# Patient Record
Sex: Female | Born: 1995 | Race: White | Hispanic: No | Marital: Single | Smoking: Never smoker
Health system: Southern US, Community
[De-identification: ages and names within clinical notes are randomized; demographics above are authoritative.]

## PROBLEM LIST (undated history)

## (undated) DIAGNOSIS — F329 Major depressive disorder, single episode, unspecified: Secondary | ICD-10-CM

## (undated) DIAGNOSIS — J45909 Unspecified asthma, uncomplicated: Secondary | ICD-10-CM

## (undated) DIAGNOSIS — R000 Tachycardia, unspecified: Secondary | ICD-10-CM

## (undated) DIAGNOSIS — F419 Anxiety disorder, unspecified: Secondary | ICD-10-CM

## (undated) DIAGNOSIS — F909 Attention-deficit hyperactivity disorder, unspecified type: Secondary | ICD-10-CM

## (undated) DIAGNOSIS — I951 Orthostatic hypotension: Secondary | ICD-10-CM

## (undated) DIAGNOSIS — I498 Other specified cardiac arrhythmias: Secondary | ICD-10-CM

## (undated) DIAGNOSIS — F32A Depression, unspecified: Secondary | ICD-10-CM

## (undated) DIAGNOSIS — G90A Postural orthostatic tachycardia syndrome (POTS): Secondary | ICD-10-CM

## (undated) HISTORY — PX: WISDOM TOOTH EXTRACTION: SHX21

---

## 2015-10-18 ENCOUNTER — Emergency Department (HOSPITAL_COMMUNITY): Payer: BLUE CROSS/BLUE SHIELD

## 2015-10-18 ENCOUNTER — Emergency Department (HOSPITAL_COMMUNITY)
Admission: EM | Admit: 2015-10-18 | Discharge: 2015-10-19 | Disposition: A | Discharge status: Home / Self Care | Payer: BLUE CROSS/BLUE SHIELD | Attending: Emergency Medicine | Admitting: Emergency Medicine

## 2015-10-18 ENCOUNTER — Encounter (HOSPITAL_COMMUNITY): Payer: Self-pay | Admitting: Oncology

## 2015-10-18 DIAGNOSIS — Y9301 Activity, walking, marching and hiking: Secondary | ICD-10-CM | POA: Diagnosis not present

## 2015-10-18 DIAGNOSIS — S43015A Anterior dislocation of left humerus, initial encounter: Secondary | ICD-10-CM | POA: Diagnosis not present

## 2015-10-18 DIAGNOSIS — S43005A Unspecified dislocation of left shoulder joint, initial encounter: Secondary | ICD-10-CM

## 2015-10-18 DIAGNOSIS — W1789XA Other fall from one level to another, initial encounter: Secondary | ICD-10-CM | POA: Diagnosis not present

## 2015-10-18 DIAGNOSIS — M545 Low back pain, unspecified: Secondary | ICD-10-CM

## 2015-10-18 DIAGNOSIS — Z8659 Personal history of other mental and behavioral disorders: Secondary | ICD-10-CM | POA: Diagnosis not present

## 2015-10-18 DIAGNOSIS — Y9289 Other specified places as the place of occurrence of the external cause: Secondary | ICD-10-CM | POA: Insufficient documentation

## 2015-10-18 DIAGNOSIS — Y998 Other external cause status: Secondary | ICD-10-CM | POA: Diagnosis not present

## 2015-10-18 DIAGNOSIS — J45909 Unspecified asthma, uncomplicated: Secondary | ICD-10-CM | POA: Insufficient documentation

## 2015-10-18 DIAGNOSIS — Z3202 Encounter for pregnancy test, result negative: Secondary | ICD-10-CM | POA: Diagnosis not present

## 2015-10-18 DIAGNOSIS — S3992XA Unspecified injury of lower back, initial encounter: Secondary | ICD-10-CM | POA: Insufficient documentation

## 2015-10-18 DIAGNOSIS — S4992XA Unspecified injury of left shoulder and upper arm, initial encounter: Secondary | ICD-10-CM | POA: Diagnosis present

## 2015-10-18 HISTORY — DX: Unspecified asthma, uncomplicated: J45.909

## 2015-10-18 HISTORY — DX: Anxiety disorder, unspecified: F41.9

## 2015-10-18 HISTORY — DX: Depression, unspecified: F32.A

## 2015-10-18 HISTORY — DX: Attention-deficit hyperactivity disorder, unspecified type: F90.9

## 2015-10-18 HISTORY — DX: Major depressive disorder, single episode, unspecified: F32.9

## 2015-10-18 NOTE — ED Notes (Signed)
Pt reports slack lining, similar to tight rope walking.  Pt fell landing on the grass on her left shoulder.  Pt is c/o left shoulder and lower back pain.  Did not take OTC pain relievers PTA.  Pt rates pain 10/10 aching, and sharp shooting w/ movement.

## 2015-10-19 ENCOUNTER — Emergency Department (HOSPITAL_COMMUNITY): Payer: BLUE CROSS/BLUE SHIELD

## 2015-10-19 LAB — POC URINE PREG, ED: Preg Test, Ur: NEGATIVE

## 2015-10-19 MED ORDER — FENTANYL CITRATE (PF) 100 MCG/2ML IJ SOLN
50.0000 ug | Freq: Once | INTRAMUSCULAR | Status: AC
  Administered 2015-10-19: 50 ug via INTRAVENOUS
  Filled 2015-10-19: qty 2

## 2015-10-19 MED ORDER — DEXTROSE 5 % IV SOLN
1000.0000 mg | Freq: Once | INTRAVENOUS | Status: AC
  Administered 2015-10-19: 1000 mg via INTRAVENOUS
  Filled 2015-10-19: qty 10

## 2015-10-19 MED ORDER — NAPROXEN 500 MG PO TABS: 500.0000 mg | ORAL_TABLET | Freq: Two times a day (BID) | ORAL | Status: DC

## 2015-10-19 MED ORDER — FENTANYL CITRATE (PF) 100 MCG/2ML IJ SOLN
100.0000 ug | Freq: Once | INTRAMUSCULAR | Status: AC
  Administered 2015-10-19: 100 ug via INTRAVENOUS
  Filled 2015-10-19: qty 2

## 2015-10-19 MED ORDER — METHOCARBAMOL 1000 MG/10ML IJ SOLN: 1000.0000 mg | Freq: Once | INTRAMUSCULAR | Status: DC

## 2015-10-19 MED ORDER — DIAZEPAM 5 MG/ML IJ SOLN
5.0000 mg | Freq: Once | INTRAMUSCULAR | Status: AC
  Administered 2015-10-19: 5 mg via INTRAVENOUS
  Filled 2015-10-19: qty 2

## 2015-10-19 MED ORDER — METHOCARBAMOL 500 MG PO TABS: 500.0000 mg | ORAL_TABLET | Freq: Two times a day (BID) | ORAL | Status: DC

## 2015-10-19 MED ORDER — KETOROLAC TROMETHAMINE 30 MG/ML IJ SOLN
30.0000 mg | Freq: Once | INTRAMUSCULAR | Status: AC
  Administered 2015-10-19: 30 mg via INTRAVENOUS
  Filled 2015-10-19: qty 1

## 2015-10-19 NOTE — ED Provider Notes (Signed)
CSN: [id number]     Arrival date & time 10/18/15  2243 History   First MD Initiated Contact with Patient 10/18/15 2313     Chief Complaint  Patient presents with  . Fall     (Consider location/radiation/quality/duration/timing/severity/associated sxs/prior Treatment) HPI Comments: Patient presents today with a chief complaint of left shoulder pain.  She states that around 6:30 PM this evening she was slack lining, which is similar to tight rope walking, three feet high in the air when she fell off of it and landed on her left shoulder.  Pain of the shoulder has been constant since that time.  She also reports left lower back pain.  Denies any head injury or LOC.  She has not taken anything for pain prior to arrival.  She denies nausea, vomiting, vision changes, neck pain, elbow pain, wrist pain, neck pain, numbness, or tingling.  She has been ambulatory since the fall.  She denies prior dislocation of the shoulder.    Patient is a 20 y.o. female presenting with fall. The history is provided by the patient.  Fall    Past Medical History  Diagnosis Date  . ADHD (attention deficit hyperactivity disorder)   . Anxiety   . Depression   . Asthma     not active since childhood   History reviewed. No pertinent past surgical history. No family history on file. Social History  Substance Use Topics  . Smoking status: Never Smoker   . Smokeless tobacco: Never Used  . Alcohol Use: Yes   OB History    No data available     Review of Systems  All other systems reviewed and are negative.     Allergies  Albuterol  Home Medications   Prior to Admission medications   Not on File   BP 122/78 mmHg  Pulse 88  Temp(Src) 98.1 F (36.7 C) (Oral)  Resp 14  Ht  (1.651 m)  Wt 54.432 kg  BMI 19.97 kg/m2  SpO2 100%  LMP 09/17/2015 (Approximate) Physical Exam  Constitutional: She appears well-developed and well-nourished.  HENT:  Head: Normocephalic and atraumatic.  Eyes: EOM are  normal. Pupils are equal, round, and reactive to light.  Neck: Normal range of motion. Neck supple.  Cardiovascular: Normal rate, regular rhythm and normal heart sounds.   Pulses:      Radial pulses are 2+ on the right side, and 2+ on the left side.  Pulmonary/Chest: Effort normal and breath sounds normal.  Musculoskeletal:       Right shoulder: She exhibits normal range of motion, no tenderness, no bony tenderness and no swelling.       Left shoulder: She exhibits decreased range of motion, tenderness, bony tenderness and deformity. She exhibits no effusion, no laceration and normal pulse.       Right elbow: She exhibits normal range of motion, no swelling and no deformity. No tenderness found.       Right wrist: She exhibits normal range of motion, no tenderness, no bony tenderness, no swelling and no deformity.       Cervical back: She exhibits normal range of motion, no tenderness, no bony tenderness, no swelling, no edema and no deformity.       Thoracic back: She exhibits normal range of motion, no tenderness, no bony tenderness, no swelling, no edema and no deformity.       Lumbar back: She exhibits normal range of motion, no tenderness, no bony tenderness, no swelling, no edema and no  deformity.  Patient ambulating without difficulty  Neurological: She is alert. She has normal strength. No cranial nerve deficit or sensory deficit. Gait normal.  Distal sensation of right hand intact Grip strength 5/5 bilaterally  Skin: Skin is warm and dry.  Psychiatric: She has a normal mood and affect.  Nursing note and vitals reviewed.   ED Course  Procedures (including critical care time) Labs Review Labs Reviewed  POC URINE PREG, ED    Imaging Review Dg Shoulder Left  10/18/2015  CLINICAL DATA:  Fall onto left shoulder with left shoulder pain and limited range of motion. EXAM: LEFT SHOULDER - 2+ VIEW COMPARISON:  None. FINDINGS: Anterior inferior dislocation of the humerus with respect to  the glenoid. No acute fracture. The acromioclavicular joint is congruent. IMPRESSION: Anterior shoulder dislocation. Electronically Signed   By: Rubye OaksMelanie  Ehinger M.D.   On: 10/18/2015 23:28   I have personally reviewed and evaluated these images and lab results as part of my medical decision-making.   EKG Interpretation None      MDM   Final diagnoses:  None   Patient presents today with left shoulder pain that has been present since falling while slack lining approximately 3 feet off of the ground.  She states that she landed on her shoulder when she fell.  Xray showing anterior shoulder dislocation.  She is neurovascularly intact.  She is also complaining of lower back pain.  No midline spinal tenderness to palpation.  She denies head injury or LOC.  She has been ambulatory since the fall.  Patient recently ate a large meal prior to arrival.  Therefore, conscious sedation could not be performed.  Patient given Toradol and Robaxin and put in the prone position with weights. Patient signed out to [facility] Mut[facility]augh, PA-C who will follow up and reassess patient.      Santiago GladHeather Mansur Patti, PA-C 10/20/15 2123  Cy BlamerApril Palumbo, MD 10/23/15 2321

## 2015-10-19 NOTE — Discharge Instructions (Signed)
1. Medications: Naproxen, Robaxin, usual home medications 2. Treatment: rest, drink plenty of fluids, use sling with attention to range of motion of her shoulder throughout the day 3. Follow Up: Please followup with the listed orthopedic in 7-10 days for discussion of your diagnoses and further evaluation after today's visit; if you do not have a primary care doctor use the resource guide provided to find one; Please return to the ER for worsening pain, symptoms of repeat dislocation  Shoulder Dislocation A shoulder dislocation happens when the upper arm bone (humerus) moves out of the shoulder joint. The shoulder joint is the part of the shoulder where the humerus, shoulder blade (scapula), and collarbone (clavicle) meet. CAUSES This condition is often caused by:  A fall.  A hit to the shoulder.  A forceful movement of the shoulder. RISK FACTORS This condition is more likely to develop in people who play sports. SYMPTOMS Symptoms of this condition include:  Deformity of the shoulder.  Intense pain.  Inability to move the shoulder.  Numbness, weakness, or tingling in your neck or down your arm.  Bruising or swelling around your shoulder. DIAGNOSIS This condition is diagnosed with a physical exam. After the exam, tests may be done to check for related problems. Tests that may be done include:  X-ray. This may be done to check for broken bones.  MRI. This may be done to check for damage to the tissues around the shoulder.  Electromyogram. This may be done to check for nerve damage. TREATMENT This condition is treated with a procedure to place the humerus back in the joint. This procedure is called a reduction. There are two types of reduction:  Closed reduction. In this procedure, the humerus is placed back in the joint without surgery. The health care provider uses his or her hands to guide the bone back into place.  Open reduction. In this procedure, the humerus is placed  back in the joint with surgery. An open reduction may be recommended if:  You have a weak shoulder joint or weak ligaments.  You have had more than one shoulder dislocation.  The nerves or blood vessels around your shoulder have been damaged. After the humerus is placed back into the joint, your arm will be placed in a splint or sling to prevent it from moving. You will need to wear the splint or sling until your shoulder heals. When the splint or sling is removed, you may have physical therapy to help improve the range of motion in your shoulder joint. HOME CARE INSTRUCTIONS If You Have a Splint or Sling:  Wear it as told by your health care provider. Remove it only as told by your health care provider.  Loosen it if your fingers become numb and tingle, or if they turn cold and blue.  Keep it clean and dry. Bathing  Do not take baths, swim, or use a hot tub until your health care provider approves. Ask your health care provider if you can take showers. You may only be allowed to take sponge baths for bathing.  If your health care provider approves bathing and showering, cover your splint or sling with a watertight plastic bag to protect it from water. Do not let the splint or sling get wet. Managing Pain, Stiffness, and Swelling  If directed, apply ice to the injured area.  Put ice in a plastic bag.  Place a towel between your skin and the bag.  Leave the ice on for 20 minutes, 2-3  times per day.  Move your fingers often to avoid stiffness and to decrease swelling.  Raise (elevate) the injured area above the level of your heart while you are sitting or lying down. Driving  Do not drive while wearing a splint or sling on a hand that you use for driving.  Do not drive or operate heavy machinery while taking pain medicine. Activity  Return to your normal activities as told by your health care provider. Ask your health care provider what activities are safe for you.  Perform  range-of-motion exercises only as told by your health care provider.  Exercise your hand by squeezing a soft ball. This helps to decrease stiffness and swelling in your hand and wrist. General Instructions  Take over-the-counter and prescription medicines only as told by your health care provider.  Do not use any tobacco products, including cigarettes, chewing tobacco, or e-cigarettes. Tobacco can delay bone and tissue healing. If you need help quitting, ask your health care provider.  Keep all follow-up visits as told by your health care provider. This is important. SEEK MEDICAL CARE IF:  Your splint or sling gets damaged. SEEK IMMEDIATE MEDICAL CARE IF:  Your pain gets worse rather than better.  You lose feeling in your arm or hand.  Your arm or hand becomes white and cold.   This information is not intended to replace advice given to you by your health care provider. Make sure you discuss any questions you have with your health care provider.   Document Released: 03/23/2001 Document Revised: 03/19/2015 Document Reviewed: 10/21/2014 Elsevier Interactive Patient Education [employer]

## 2015-10-19 NOTE — ED Provider Notes (Signed)
Care assumed from [facility], [place].  Hannah Mathis is a 20 y.o. female presents with left shoulder pain after a fall while she was slack lining 3 ft off the ground, landing on her left shoulder.  X-ray with anterior dislocation.  She has been given Toradol and robaxin.  Pt had a large meal just PTA, therefore no conscious sedation.     Physical Exam  BP 121/75 mmHg  Pulse 89  Temp(Src) 98.1 F (36.7 C) (Oral)  Resp 16  Ht  (1.651 m)  Wt 54.432 kg  BMI 19.97 kg/m2  SpO2 100%  LMP 09/17/2015 (Approximate)  Physical Exam   Face to face Exam:   General: Awake  HEENT: Atraumatic  Resp: Normal effort  Abd: Nondistended  Neuro:No focal weakness  MSK: palpable anterior dislocation of the left shoulder; midline L spine tenderness and left sided paraspinal tenderness; FROM of the bilateral hips Lymph: No adenopathy  Results for orders placed or performed during the hospital encounter of 10/18/15  POC urine preg, ED (not at [facility])  Result Value Ref Range   Preg Test, Ur NEGATIVE NEGATIVE   Dg Lumbar Spine Complete  10/19/2015  CLINICAL DATA:  Left lateral low back pain after falling today. EXAM: LUMBAR SPINE - COMPLETE 4+ VIEW COMPARISON:  None. FINDINGS: There is no evidence of lumbar spine fracture. Alignment is normal. Intervertebral disc spaces are maintained. IMPRESSION: Negative. Electronically Signed   By: Ellery Plunk M.D.   On: 10/19/2015 03:21   Dg Shoulder Left  10/18/2015  CLINICAL DATA:  Fall onto left shoulder with left shoulder pain and limited range of motion. EXAM: LEFT SHOULDER - 2+ VIEW COMPARISON:  None. FINDINGS: Anterior inferior dislocation of the humerus with respect to the glenoid. No acute fracture. The acromioclavicular joint is congruent. IMPRESSION: Anterior shoulder dislocation. Electronically Signed   By: Rubye Oaks M.D.   On: 10/18/2015 23:28     ED Course  Reduction of dislocation Date/Time: 10/19/2015 4:55 AM Performed by:  Dierdre Forth Authorized by: Dierdre Forth Consent: Verbal consent obtained. Risks and benefits: risks, benefits and alternatives were discussed Consent given by: patient Patient understanding: patient states understanding of the procedure being performed Patient consent: the patient's understanding of the procedure matches consent given Procedure consent: procedure consent matches procedure scheduled Relevant documents: relevant documents present and verified Site marked: the operative site was marked Imaging studies: imaging studies available Required items: required blood products, implants, devices, and special equipment available Patient identity confirmed: verbally with patient and arm band Time out: Immediately prior to procedure a "time out" was called to verify the correct patient, procedure, equipment, support staff and site/side marked as required. Preparation: Patient was prepped and draped in the usual sterile fashion. Local anesthesia used: no Patient sedated: no Patient tolerance: Patient tolerated the procedure well with no immediate complications Comments: Pt with improvement in dislocation with weights and completion of the reduction with the external rotation technique.     1. Shoulder dislocation, left, initial encounter   2. Midline low back pain without sciatica     MDM  Plan: pt will be weighted (10lbs) and attempt passive reduction.  Pt given toradol and robaxin.    4:59 AM Pt given fentanyl and valium as pain is unbearable.  Pt with improvement in dislocation with weights and completion of the reduction with the external rotation technique.  Pulse, motor and sensation of LUE pre and post.  FROM after reduction.    BP 111/75 mmHg  Pulse  79  Temp(Src) 97.7 F (36.5 C) (Oral)  Resp 16  Ht 5\' 5"  (1.651 m)  Wt 54.432 kg  BMI 19.97 kg/m2  SpO2 97%  LMP 09/17/2015 (Approximate)   The patient was discussed with and seen by Dr. Nicanor AlconPalumbo who  agrees with the treatment plan.        [name], PA-C 10/19/15 0500  [name], MD 10/19/15 [phone]

## 2015-10-19 NOTE — ED Notes (Signed)
Pt reports understanding of discharge information. No questions at time of discharge 

## 2016-07-30 ENCOUNTER — Emergency Department (HOSPITAL_COMMUNITY): Payer: BLUE CROSS/BLUE SHIELD

## 2016-07-30 ENCOUNTER — Emergency Department (HOSPITAL_COMMUNITY)
Admission: EM | Admit: 2016-07-30 | Discharge: 2016-07-30 | Disposition: A | Discharge status: Home / Self Care | Payer: BLUE CROSS/BLUE SHIELD | Attending: Emergency Medicine | Admitting: Emergency Medicine

## 2016-07-30 ENCOUNTER — Encounter (HOSPITAL_COMMUNITY): Payer: Self-pay

## 2016-07-30 ENCOUNTER — Other Ambulatory Visit: Payer: Self-pay

## 2016-07-30 DIAGNOSIS — Z79899 Other long term (current) drug therapy: Secondary | ICD-10-CM | POA: Insufficient documentation

## 2016-07-30 DIAGNOSIS — S0093XA Contusion of unspecified part of head, initial encounter: Secondary | ICD-10-CM | POA: Insufficient documentation

## 2016-07-30 DIAGNOSIS — J45909 Unspecified asthma, uncomplicated: Secondary | ICD-10-CM | POA: Insufficient documentation

## 2016-07-30 DIAGNOSIS — S0990XA Unspecified injury of head, initial encounter: Secondary | ICD-10-CM | POA: Diagnosis present

## 2016-07-30 DIAGNOSIS — R55 Syncope and collapse: Secondary | ICD-10-CM | POA: Diagnosis not present

## 2016-07-30 DIAGNOSIS — Y929 Unspecified place or not applicable: Secondary | ICD-10-CM | POA: Diagnosis not present

## 2016-07-30 DIAGNOSIS — W228XXA Striking against or struck by other objects, initial encounter: Secondary | ICD-10-CM | POA: Diagnosis not present

## 2016-07-30 DIAGNOSIS — Y999 Unspecified external cause status: Secondary | ICD-10-CM | POA: Diagnosis not present

## 2016-07-30 DIAGNOSIS — S060X1A Concussion with loss of consciousness of 30 minutes or less, initial encounter: Secondary | ICD-10-CM | POA: Insufficient documentation

## 2016-07-30 DIAGNOSIS — F909 Attention-deficit hyperactivity disorder, unspecified type: Secondary | ICD-10-CM | POA: Insufficient documentation

## 2016-07-30 DIAGNOSIS — Y939 Activity, unspecified: Secondary | ICD-10-CM | POA: Insufficient documentation

## 2016-07-30 LAB — BASIC METABOLIC PANEL
ANION GAP: 7 (ref 5–15)
BUN: 13 mg/dL (ref 6–20)
CO2: 25 mmol/L (ref 22–32)
Calcium: 9.4 mg/dL (ref 8.9–10.3)
Chloride: 107 mmol/L (ref 101–111)
Creatinine, Ser: 0.76 mg/dL (ref 0.44–1.00)
GLUCOSE: 75 mg/dL (ref 65–99)
POTASSIUM: 4 mmol/L (ref 3.5–5.1)
Sodium: 139 mmol/L (ref 135–145)

## 2016-07-30 LAB — URINALYSIS, ROUTINE W REFLEX MICROSCOPIC
Bilirubin Urine: NEGATIVE
GLUCOSE, UA: NEGATIVE mg/dL
HGB URINE DIPSTICK: NEGATIVE
KETONES UR: NEGATIVE mg/dL
Leukocytes, UA: NEGATIVE
NITRITE: NEGATIVE
PROTEIN: NEGATIVE mg/dL
Specific Gravity, Urine: 1.004 — ABNORMAL LOW (ref 1.005–1.030)
pH: 7 (ref 5.0–8.0)

## 2016-07-30 LAB — CBC
HEMATOCRIT: 40.1 % (ref 36.0–46.0)
Hemoglobin: 13.9 g/dL (ref 12.0–15.0)
MCH: 28.4 pg (ref 26.0–34.0)
MCHC: 34.7 g/dL (ref 30.0–36.0)
MCV: 82 fL (ref 78.0–100.0)
Platelets: 246 10*3/uL (ref 150–400)
RBC: 4.89 MIL/uL (ref 3.87–5.11)
RDW: 14.1 % (ref 11.5–15.5)
WBC: 4 10*3/uL (ref 4.0–10.5)

## 2016-07-30 LAB — CBG MONITORING, ED: GLUCOSE-CAPILLARY: 79 mg/dL (ref 65–99)

## 2016-07-30 LAB — I-STAT BETA HCG BLOOD, ED (MC, WL, AP ONLY): HCG, QUANTITATIVE: 20.4 m[IU]/mL — AB (ref <5)

## 2016-07-30 MED ORDER — ONDANSETRON 4 MG PO TBDP
4.0000 mg | ORAL_TABLET | Freq: Once | ORAL | Status: AC
  Administered 2016-07-30: 4 mg via ORAL
  Filled 2016-07-30: qty 1

## 2016-07-30 MED ORDER — ACETAMINOPHEN 325 MG PO TABS
650.0000 mg | ORAL_TABLET | Freq: Once | ORAL | Status: AC
  Administered 2016-07-30: 650 mg via ORAL
  Filled 2016-07-30: qty 2

## 2016-07-30 NOTE — Discharge Instructions (Signed)
Also need to repeat pregnancy test in 2 weeks and if positive follow up with OB/GYN

## 2016-07-30 NOTE — ED Triage Notes (Addendum)
Pt c/o head injury/pain, dizziness, and nausea after a fall d/t a syncopal episode this afternoon.  Pain score 6/10.  Pt reports Hx of concussions.  Sts she got out of bed to answer her door when episode happened.

## 2016-07-30 NOTE — ED Provider Notes (Signed)
[facility]-EMERGENCY DEPT Provider Note   CSN: [id number] Arrival date & time: 07/30/16  1327     History   Chief Complaint Chief Complaint  Patient presents with  . Loss of Consciousness  . Head Injury    HPI Jhayla Podgorski is a 21 y.o. female.  Patient is a healthy 21 year old female with a history of asthma, depression and anxiety. She only takes Adderall for her ADHD. She is a long distance runner but states today around noon she got up to talk with her roommate when she suddenly recalls her vision going dim and roommate states she stumbled and eventually ended up falling hitting her head on the desk and passing out. She denied feeling queasy, chest pain or palpitations. She had not eaten all day prior to this event happening. She does not know of any reason she would be dehydrated has not been drinking excessive amounts of alcohol and currently has an implant birth control regular periods and uses protection every time she has sex. No prior episodes of syncope in the past. She has never had syncope with exertion.   The history is provided by the patient.  Loss of Consciousness   This is a new problem. The current episode started 6 to 12 hours ago. The problem occurs constantly. The problem has been resolved. She lost consciousness for a period of less than one minute. The problem is associated with standing up. Pertinent negatives include nausea. Associated symptoms comments: No initial sx during the event but she fell and hit her head on the desk and since the event after hitting her head she has had pain in the right side of head over the ear and some nausea and dizziness.  . She has tried nothing for the symptoms. The treatment provided no relief.  Head Injury      Past Medical History:  Diagnosis Date  . ADHD (attention deficit hyperactivity disorder)   . Anxiety   . Asthma    not active since childhood  . Depression     There are no active problems to display for this  patient.   Past Surgical History:  Procedure Laterality Date  . WISDOM TOOTH EXTRACTION      OB History    No data available       Home Medications    Prior to Admission medications   Medication Sig Start Date End Date Taking? Authorizing Provider  amphetamine-dextroamphetamine (ADDERALL) 10 MG tablet Take 10 mg by mouth 3 (three) times daily. 07/16/16  Yes Historical Provider, MD  Cyanocobalamin (VITAMIN B 12 PO) Take 1 tablet by mouth daily.   Yes Historical Provider, MD  etonogestrel (NEXPLANON) 68 MG IMPL implant Inject into the skin.   Yes Historical Provider, MD  Omega-3 Fatty Acids (OMEGA 3 PO) Take 1 tablet by mouth daily.   Yes Historical Provider, MD  OVER THE COUNTER MEDICATION Take 1 tablet by mouth 3 (three) times daily. IB Guard--takes after a meal   Yes Historical Provider, MD  Probiotic Product (ALIGN PO) Take 1 tablet by mouth daily.   Yes Historical Provider, MD  methocarbamol (ROBAXIN) 500 MG tablet Take 1 tablet (500 mg total) by mouth 2 (two) times daily. Patient not taking: Reported on 07/30/2016 10/19/15   Dahlia Client Muthersbaugh, PA-C  naproxen (NAPROSYN) 500 MG tablet Take 1 tablet (500 mg total) by mouth 2 (two) times daily with a meal. Patient not taking: Reported on 07/30/2016 10/19/15   Dierdre Forth, PA-C    Family History History  reviewed. No pertinent family history.  Social History Social History  Substance Use Topics  . Smoking status: Never Smoker  . Smokeless tobacco: Never Used  . Alcohol use Yes     Allergies   Albuterol   Review of Systems Review of Systems  Cardiovascular: Positive for syncope.  Gastrointestinal: Negative for nausea.  All other systems reviewed and are negative.    Physical Exam Updated Vital Signs BP 100/64   Pulse 64   Temp 98.4 F (36.9 C) (Oral)   Resp 17   Ht 5\' 5"  (1.651 m)   Wt 120 lb (54.4 kg)   SpO2 98%   BMI 19.97 kg/m   Physical Exam  Constitutional: She is oriented to person, place, and  time. She appears well-developed and well-nourished. No distress.  HENT:  Head: Normocephalic. Head is with contusion.    Right Ear: Tympanic membrane normal.  Left Ear: Tympanic membrane normal.  Mouth/Throat: Oropharynx is clear and moist.  Eyes: Conjunctivae and EOM are normal. Pupils are equal, round, and reactive to light.  Neck: Normal range of motion. Neck supple. No spinous process tenderness and no muscular tenderness present. Normal range of motion present.  Cardiovascular: Normal rate, regular rhythm and intact distal pulses.   No murmur heard. Pulmonary/Chest: Effort normal and breath sounds normal. No respiratory distress. She has no wheezes. She has no rales.  Abdominal: Soft. She exhibits no distension. There is no tenderness. There is no rebound and no guarding.  Musculoskeletal: Normal range of motion. She exhibits no edema or tenderness.  Neurological: She is alert and oriented to person, place, and time. She has normal strength. No sensory deficit. Coordination normal.  Skin: Skin is warm and dry. No rash noted. No erythema.  Psychiatric: She has a normal mood and affect. Her behavior is normal.  Nursing note and vitals reviewed.    ED Treatments / Results  Labs (all labs ordered are listed, but only abnormal results are displayed) Labs Reviewed  URINALYSIS, ROUTINE W REFLEX MICROSCOPIC - Abnormal; Notable for the following:       Result Value   Color, Urine STRAW (*)    Specific Gravity, Urine 1.004 (*)    All other components within normal limits  I-STAT BETA HCG BLOOD, ED ([facility], [facility], [facility] ONLY) - Abnormal; Notable for the following:    I-stat hCG, quantitative 20.4 (*)    All other components within normal limits  BASIC METABOLIC PANEL  CBC  CBG MONITORING, ED  CBG MONITORING, ED    EKG  EKG Interpretation  Date/Time:  Friday July 30 2016 13:42:11 EST Ventricular Rate:  73 PR Interval:    QRS Duration: 97 QT Interval:  348 QTC Calculation: 384 R  Axis:   93 Text Interpretation:  Sinus rhythm Borderline right axis deviation Abnormal T, consider ischemia, lateral leads No previous tracing Confirmed by Anitra LauthPLUNKETT  MD, Nary Sneed ([id number]) on 07/30/2016 8:24:48 PM       Radiology Dg Chest 2 View  Result Date: 07/30/2016 CLINICAL DATA:  Syncopal episode EXAM: CHEST  2 VIEW COMPARISON:  None. FINDINGS: The heart size and mediastinal contours are within normal limits. Both lungs are clear. The visualized skeletal structures are unremarkable. IMPRESSION: No active cardiopulmonary disease. Electronically Signed   By: Jasmine PangKim  Fujinaga M.D.   On: 07/30/2016 20:53   Ct Head Wo Contrast  Result Date: 07/30/2016 CLINICAL DATA:  Syncope with head injury.  Dizziness and nausea. EXAM: CT HEAD WITHOUT CONTRAST TECHNIQUE: Contiguous axial images were obtained  from the base of the skull through the vertex without intravenous contrast. COMPARISON:  None. FINDINGS: Brain: No evidence of acute infarction, hemorrhage, hydrocephalus, extra-axial collection or mass lesion/mass effect. Vascular: No hyperdense vessel or unexpected calcification. Skull: Normal. Negative for fracture or focal lesion. Sinuses/Orbits: Paranasal sinuses and mastoid air cells are clear. The visualized orbits are unremarkable. Other: None. IMPRESSION: No acute intracranial abnormality. Electronically Signed   By: Rubye Oaks M.D.   On: 07/30/2016 21:19    Procedures Procedures (including critical care time)  Medications Ordered in ED Medications  acetaminophen (TYLENOL) tablet 650 mg (650 mg Oral Given 07/30/16 2039)  ondansetron (ZOFRAN-ODT) disintegrating tablet 4 mg (4 mg Oral Given 07/30/16 2039)     Initial Impression / Assessment and Plan / ED Course  I have reviewed the triage vital signs and the nursing notes.  Pertinent labs & imaging results that were available during my care of the patient were reviewed by me and considered in my medical decision making (see chart for  details).     Patient is a healthy 21 year old female presenting today after an episode of syncope and head injury as a result of the syncope. This occurred after she had just stood up. No history of reasons she would be dehydrated but she is a long distance runner and not eating this morning. Patient has a birth control implant in regular periods. HCG today is 20 but feel most likely false positive. Recommended that patient recheck a urine pregnancy test in a few weeks. She has no abdominal pain or recent bleed she has an act up at pregnancy. Labs without acute findings. Initial EKG was slightly abnormal however there was artifact present. Repeat EKG shows borderline right axis deviation but otherwise within normal limits. Low suspicion for PE, dysrhythmia. Head CT negative for acute fracture or bleed however patient does have a mild concussion. Return to play precautions discussed as well as returning to school. Patient was given follow-up with neurology if symptoms do not resolve.  Final Clinical Impressions(s) / ED Diagnoses   Final diagnoses:  Concussion with loss of consciousness of 30 minutes or less, initial encounter  Syncope and collapse    New Prescriptions Discharge Medication List as of 07/30/2016 11:10 PM       Gwyneth Sprout, MD 07/30/16 2349

## 2016-08-13 ENCOUNTER — Encounter: Payer: Self-pay | Admitting: Neurology

## 2016-08-13 ENCOUNTER — Ambulatory Visit (INDEPENDENT_AMBULATORY_CARE_PROVIDER_SITE_OTHER): Payer: BLUE CROSS/BLUE SHIELD | Admitting: Neurology

## 2016-08-13 VITALS — BP 106/68 | HR 63 | Resp 14 | Ht 65.0 in | Wt 117.0 lb

## 2016-08-13 DIAGNOSIS — F988 Other specified behavioral and emotional disorders with onset usually occurring in childhood and adolescence: Secondary | ICD-10-CM

## 2016-08-13 DIAGNOSIS — I498 Other specified cardiac arrhythmias: Secondary | ICD-10-CM

## 2016-08-13 DIAGNOSIS — R42 Dizziness and giddiness: Secondary | ICD-10-CM | POA: Insufficient documentation

## 2016-08-13 DIAGNOSIS — R000 Tachycardia, unspecified: Secondary | ICD-10-CM | POA: Diagnosis not present

## 2016-08-13 DIAGNOSIS — F0781 Postconcussional syndrome: Secondary | ICD-10-CM | POA: Diagnosis not present

## 2016-08-13 DIAGNOSIS — F09 Unspecified mental disorder due to known physiological condition: Secondary | ICD-10-CM

## 2016-08-13 DIAGNOSIS — G90A Postural orthostatic tachycardia syndrome (POTS): Secondary | ICD-10-CM | POA: Insufficient documentation

## 2016-08-13 DIAGNOSIS — I951 Orthostatic hypotension: Secondary | ICD-10-CM

## 2016-08-13 MED ORDER — FLUDROCORTISONE ACETATE 0.1 MG PO TABS: 0.1000 mg | ORAL_TABLET | Freq: Every day | ORAL | 5 refills | Status: AC

## 2016-08-13 NOTE — Progress Notes (Signed)
[facility]  PATIENT: Hannah Mathis DOB: February 18, 1996  REFERRING DOCTOR OR PCP:  Dr. Celso Sickle [facility]  Phone [phone]) SOURCE: patient, mother, ED notes, CT scan reports, images on PACS  _________________________________   HISTORICAL  CHIEF COMPLAINT:  Chief Complaint  Patient presents with  . Loss of Consciousness    Hannah Mathis is here with her mother Rushie Goltz for eval of syncopal episode on 07-30-16.  Sts. around 1230, she stood up, walked across the room, had sudden loc.  Denies dizziness or other sx. prior to loc.  Sts. when she fell she hit the right side of her head on her desk.  She continues to c/o soreness to that area.  Sts. in the past she has had dizziness, and "the blood rushing out of my head" with position changes.  Sts. since then she has been careful to change positions slowly and this has helped.  No further   . Head Injury    episodes of loc since then.  Orthostatic VS done today/fim    HISTORY OF PRESENT ILLNESS:  I had the pleasure seeing you patient, Hannah Mathis, at [facility] for neurologic consultation regarding her and her soles of lightheadedness.    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  On 07/30/2016, she was talking to her roommate in the dorm when she stepped backwards and tripped over the endtable, hitting her head on her desk and hitting her head.   She had brief loss of consciousness for several seconds.   She does not recall couple of seconds treat talking to her roommate and that she does not know if she was having one of her lightheaded spells at that time. When she came too, she felt confused and had trouble getting words out.    She went to the ED and had a CT (normal).   She notes she was better in the ED but still felt that she was in a mental fog.   She had a headache after the fall.  The headache is still occurring daily but is less intense. The headache is  bilateral hopping and is worse when she moves her head. There is mild photophobia.    Since the head injury, she has had more trouble with her concentration, word finding and math.   She notes especially that her processing speed seems to be slower. She feels that it is taking her 3-4 times as long to do an assignment as it should. Appendectomy to reread passages in order to understand them.  She has had spells of lightheadedness, often after a major or sudden movement x many years.  The lightheadedness usually occurs when she is standing.   Besides movements, the lightheadedness Also be triggered by exercise. This also occurs if she does interval training (she is on the track team) with higher intensity followed by milder intensity running.     During her second year of college, she had a lot of vertigo and saw a neurologist back in [place].   Besides vertigo, her vision would go black and she would feel cold and had nausea and vomiting.    She had an MRI but never got any results.   She was doing self-Epley maneuvers and was prescribed meclizine.    Symptoms improved after a while.     She had 2 concussions in the past.   The first one occurred at about age [age] in gym class when she  was hit by a basketball but she does not know if there was LOC.  However, for the next 6 weeks, she had a headache and decreased focus and attention.  She feels her current symptoms are similar but more intense than that episode..   The second one occurred a few years later.   She hit her head but only felt altered for a few days and had less headache.    She has IBS and avoids eating carbs and sugars and mostly eats veafetables and protein.       REVIEW OF SYSTEMS: Constitutional: No fevers, chills, sweats, or change in appetite. Eyes: No visual changes, double vision, eye pain Ear, nose and throat: No hearing loss, ear pain, nasal congestion, sore throat Cardiovascular: No chest pain,  palpitations Respiratory: No shortness of breath at rest or with exertion.   No wheezes GastrointestinaI: She has irritable bowel syndrome. Genitourinary: No dysuria, urinary retention or frequency.  No nocturia. Musculoskeletal: No neck pain, back pain Integumentary: No rash, pruritus, skin lesions Neurological: as above.   Also has ADD Psychiatric: No depression at this time.  No anxiety Endocrine: No palpitations, diaphoresis, change in appetite, change in weigh or increased thirst Hematologic/Lymphatic: No anemia, purpura, petechiae. Allergic/Immunologic: No itchy/runny eyes, nasal congestion, recent allergic reactions, rashes  ALLERGIES: Allergies  Allergen Reactions  . Albuterol Anaphylaxis    HOME MEDICATIONS:  Current Outpatient Prescriptions:  .  amphetamine-dextroamphetamine (ADDERALL) 10 MG tablet, Take 10 mg by mouth 3 (three) times daily., Disp: , Rfl:  .  Cyanocobalamin (VITAMIN B 12 PO), Take 1 tablet by mouth daily., Disp: , Rfl:  .  etonogestrel (NEXPLANON) 68 MG IMPL implant, Inject into the skin., Disp: , Rfl:  .  Omega-3 Fatty Acids (OMEGA 3 PO), Take 1 tablet by mouth daily., Disp: , Rfl:  .  OVER THE COUNTER MEDICATION, Take 1 tablet by mouth 3 (three) times daily. IB Guard--takes after a meal, Disp: , Rfl:  .  Probiotic Product (ALIGN PO), Take 1 tablet by mouth daily., Disp: , Rfl:   PAST MEDICAL HISTORY: Past Medical History:  Diagnosis Date  . ADHD (attention deficit hyperactivity disorder)   . Anxiety   . Asthma    not active since childhood  . Depression     PAST SURGICAL HISTORY: Past Surgical History:  Procedure Laterality Date  . WISDOM TOOTH EXTRACTION      FAMILY HISTORY: Family History  Problem Relation Age of Onset  . Basal cell carcinoma Mother   . Healthy Father     SOCIAL HISTORY:  Social History   Social History  . Marital status: Single    Spouse name: N/A  . Number of children: N/A  . Years of education: N/A    Occupational History  . Not on file.   Social History Main Topics  . Smoking status: Never Smoker  . Smokeless tobacco: Never Used  . Alcohol use Yes  . Drug use: No     Comment: denies 07/30/16  . Sexual activity: Yes    Birth control/ protection: Implant   Other Topics Concern  . Not on file   Social History Narrative  . No narrative on file     PHYSICAL EXAM  Vitals:   08/13/16 0823  BP: 106/68  Pulse: 63  Resp: 14  Weight: 117 lb (53.1 kg)  Height: 5\' 5"  (1.651 m)   Orthostatic VS for the past 24 hrs:  BP- Lying Pulse- Lying BP- Sitting Pulse- Sitting BP- Standing  at 0 minutes Pulse- Standing at 0 minutes  08/13/16 0824 106/68 63 111/75 91 125/81 75   Standing x 3 minutes:   105/76, pulse 98     Body mass index is 19.47 kg/m.   General: The patient is well-developed and well-nourished and in no acute distress  Eyes:  Funduscopic exam shows normal optic discs and retinal vessels.  Neck: The neck is supple, no carotid bruits are noted.  The neck is mildly tender.  Cardiovascular: The heart has a regular rate and rhythm with a normal S1 and S2. There were no murmurs, gallops or rubs. Lungs are clear to auscultation.  Skin: Extremities are without significant edema.  Musculoskeletal:  Back is nontender  Neurologic Exam  Mental status: The patient is alert and oriented x 3 at the time of the examination. The patient has apparent normal recent and remote memory.  She has reduced attention span and concentration ability.   Speech is normal.  Cranial nerves: Extraocular movements are full. Pupils are equal, round, and reactive to light and accomodation.  Visual fields are full.  Facial symmetry is present. There is good facial sensation to soft touch bilaterally.Facial strength is normal.  Trapezius and sternocleidomastoid strength is normal. No dysarthria is noted.  The tongue is midline, and the patient has symmetric elevation of the soft palate. No obvious  hearing deficits are noted.  Motor:  Muscle bulk is normal.   Tone is normal. Strength is  5 / 5 in all 4 extremities.   Sensory: Sensory testing is intact to pinprick, soft touch and vibration sensation in all 4 extremities.  Coordination: Cerebellar testing reveals good finger-nose-finger and heel-to-shin bilaterally.  Gait and station: Station is normal.   Gait is normal. Tandem gait is normal. Romberg is negative.   Reflexes: Deep tendon reflexes are symmetric and normal bilaterally.   Plantar responses are flexor.    DIAGNOSTIC DATA (LABS, IMAGING, TESTING) - I reviewed patient records, labs, notes, testing and imaging myself where available.  Lab Results  Component Value Date   WBC 4.0 07/30/2016   HGB 13.9 07/30/2016   HCT 40.1 07/30/2016   MCV 82.0 07/30/2016   PLT 246 07/30/2016      Component Value Date/Time   NA 139 07/30/2016 1401   K 4.0 07/30/2016 1401   CL 107 07/30/2016 1401   CO2 25 07/30/2016 1401   GLUCOSE 75 07/30/2016 1401   BUN 13 07/30/2016 1401   CREATININE 0.76 07/30/2016 1401   CALCIUM 9.4 07/30/2016 1401   GFRNONAA >60 07/30/2016 1401   GFRAA >60 07/30/2016 1401       ASSESSMENT AND PLAN  Post concussion syndrome - Plan: MR BRAIN WO CONTRAST  Attention deficit disorder, unspecified hyperactivity presence  Lightheaded - Plan: MR BRAIN WO CONTRAST  POTS (postural orthostatic tachycardia syndrome)  Cognitive dysfunction   In summary, Hannah Mathis is 21 year old woman who had a head injury 2 weeks ago her head on the desk with brief loss of consciousness since then, she has had a headache that is slowly improving and a cognitive disturbance that improved the first day but continues to be troublesome to her.   Currently, she is experiencing a postconcussive syndrome. Symptoms can persist double weeks to several months and she will hopefully continue to improve over the next few weeks. I have advised her to avoid strenuous activity but that  tends workouts. I wrote a letter to ask her Professors to give her additional time for assignments and to  consider reducing assignments or the like for assignments.   She also is having multiple episodes of right headedness me often with an orthostatic component. On examination today, she did have significant orthostatic increase in pulse and mild decrease in BP.   She may have Postural orthostatic tachycardia syndrome and I will have her try fludrocortisone, initially at 0.05 mg and increased to 0.1 mg if needed.   Visually, because symptoms are persisting and have not improved much today after her event, also check an MRI of the brain to rule out hemorrhage or other findings that may require different intervention.  I will reevaluate her weeks and she is advised to call us sooner if she has any new or worsening neurologic symptoms.   Hannah Mathis A. Epimenio FootSater, MD, PhD 08/13/2016, 8:47 AM Certified in Neurology, Clinical Neurophysiology, Sleep Medicine, Pain Medicine and Neuroimaging  [facility] [address], [address] [place], [place] [id number] [phone]

## 2016-08-18 ENCOUNTER — Ambulatory Visit
Admission: RE | Admit: 2016-08-18 | Discharge: 2016-08-18 | Disposition: A | Discharge status: Home / Self Care | Payer: BLUE CROSS/BLUE SHIELD | Source: Ambulatory Visit | Attending: Neurology | Admitting: Neurology

## 2016-08-18 DIAGNOSIS — R42 Dizziness and giddiness: Secondary | ICD-10-CM

## 2016-08-18 DIAGNOSIS — F0781 Postconcussional syndrome: Secondary | ICD-10-CM

## 2016-08-20 ENCOUNTER — Telehealth: Payer: Self-pay | Admitting: *Deleted

## 2016-08-20 NOTE — Telephone Encounter (Signed)
I have spoken with Hannah Mathis this morning and per RAS, explained that MRI brain is ok.  She verbalized understanding of same/fim

## 2016-08-20 NOTE — Telephone Encounter (Signed)
-----   Message from Asa Lenteichard A Sater, MD sent at 08/19/2016  6:02 PM EST ----- Please let her know that the MRI of the brain was good.

## 2016-08-24 ENCOUNTER — Encounter: Payer: Self-pay | Admitting: Sports Medicine

## 2016-08-24 ENCOUNTER — Ambulatory Visit (INDEPENDENT_AMBULATORY_CARE_PROVIDER_SITE_OTHER): Payer: BLUE CROSS/BLUE SHIELD | Admitting: Sports Medicine

## 2016-08-24 VITALS — BP 120/77 | HR 82 | Ht 66.0 in | Wt 117.0 lb

## 2016-08-24 DIAGNOSIS — M79662 Pain in left lower leg: Secondary | ICD-10-CM

## 2016-08-24 DIAGNOSIS — R5383 Other fatigue: Secondary | ICD-10-CM

## 2016-08-24 DIAGNOSIS — T733XXA Exhaustion due to excessive exertion, initial encounter: Secondary | ICD-10-CM

## 2016-08-24 NOTE — Assessment & Plan Note (Addendum)
Patient is a relatively and experienced runner who has been doing quite a mileage I think she merits some reduction in training Exercises for hip weakness Calf exercises She needs to have some heel supports in her shoes Her current spikes have a negative drop Check a ferritin level  recovery snack within 30 minutes of finishing workouts Heel lifts given  Recheck in 6 weeks

## 2016-08-24 NOTE — Progress Notes (Signed)
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 She had run in high school but only a couple miles daily Since starting running at [employer] she has been running most days On Sundays she runs for 2 hours During track she has been doing intervals a couple days per week as well as racing on the weekends  She has had tightness in her left calf and muscle soreness She is at tight muscle areas in both thighs Aggressive massaging the training room caused bruising and pain She has done some calf exercises and stretches Since persistent pain is troublesome she comes for further evaluation  Concussion history; January 19 seen in the emergency room for a concussion She basically stood up, talking her roommate and passed out Neurology of evaluation ultimately suggested POTS She is taking fludrocortisone  Past Hx Hospitalization for adolescent adjustment and mood disorder 2012  Social history 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, [employer]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 at [employer] studying public health  Review of systems She gets frequent menstrual spotting and has an Implanon implant She generally does not eat red meat but occasionally gets craving She gets occasional abdominal pain  Physical exam Thin but pleasant female in no acute distress BP 120/77   Pulse 82   Ht 5\' 6"  (1.676 m)   Wt 117 lb (53.1 kg)   BMI 18.88 kg/m   Good alignment Leg lengths equal She has some Areas of tightness in the quadriceps and calves Hip flexion strength is good bilaterally Hip abduction is weak on the left Hip adduction strong but she gets tightness on the left Quad strength is excellent Hamstring strength excellent Calves strong and good definition  Feet show normal/ neutral arch  Running gait show slight genu valgus more on left Foot strike neutral

## 2016-08-24 NOTE — Patient Instructions (Addendum)
Left Hip_ gluteus medius is very weak  I suspect has not been firing correctly - maybe after ankle sprain  The muscle tightness may relate to this muscle imbalance and this can influence calf as well  The race flats are - 2mm drop and your training shoes are + 10 drop this is going to lead to excess stress on calf every time you race Same issue with your walking shoes being negative drop  Heel lift may be helpful  Calf exercise series  Hip exercise series  Consider much shorter long run during racing season as you may not be recovering  After training you must get rapid CHO replacement - within the first 30 minutes  With POTS you need to drink recovery fluids with salt = gatorade is fine  Try these changs for 6 weeks then return

## 2016-09-26 ENCOUNTER — Observation Stay (HOSPITAL_COMMUNITY)
Admission: EM | Admit: 2016-09-26 | Discharge: 2016-09-28 | Disposition: A | Discharge status: Home / Self Care | Payer: BLUE CROSS/BLUE SHIELD | Attending: Internal Medicine | Admitting: Internal Medicine

## 2016-09-26 ENCOUNTER — Encounter (HOSPITAL_COMMUNITY): Payer: Self-pay

## 2016-09-26 DIAGNOSIS — T447X4A Poisoning by beta-adrenoreceptor antagonists, undetermined, initial encounter: Secondary | ICD-10-CM | POA: Diagnosis not present

## 2016-09-26 DIAGNOSIS — F909 Attention-deficit hyperactivity disorder, unspecified type: Secondary | ICD-10-CM | POA: Diagnosis not present

## 2016-09-26 DIAGNOSIS — F419 Anxiety disorder, unspecified: Secondary | ICD-10-CM | POA: Diagnosis not present

## 2016-09-26 DIAGNOSIS — T50904A Poisoning by unspecified drugs, medicaments and biological substances, undetermined, initial encounter: Secondary | ICD-10-CM

## 2016-09-26 DIAGNOSIS — F988 Other specified behavioral and emotional disorders with onset usually occurring in childhood and adolescence: Secondary | ICD-10-CM | POA: Diagnosis not present

## 2016-09-26 DIAGNOSIS — T50902A Poisoning by unspecified drugs, medicaments and biological substances, intentional self-harm, initial encounter: Secondary | ICD-10-CM

## 2016-09-26 DIAGNOSIS — Z87828 Personal history of other (healed) physical injury and trauma: Secondary | ICD-10-CM | POA: Diagnosis not present

## 2016-09-26 DIAGNOSIS — I498 Other specified cardiac arrhythmias: Secondary | ICD-10-CM | POA: Diagnosis present

## 2016-09-26 DIAGNOSIS — R000 Tachycardia, unspecified: Secondary | ICD-10-CM

## 2016-09-26 DIAGNOSIS — F329 Major depressive disorder, single episode, unspecified: Secondary | ICD-10-CM | POA: Diagnosis not present

## 2016-09-26 DIAGNOSIS — W19XXXD Unspecified fall, subsequent encounter: Secondary | ICD-10-CM | POA: Insufficient documentation

## 2016-09-26 DIAGNOSIS — F0781 Postconcussional syndrome: Secondary | ICD-10-CM | POA: Diagnosis not present

## 2016-09-26 DIAGNOSIS — T50901A Poisoning by unspecified drugs, medicaments and biological substances, accidental (unintentional), initial encounter: Secondary | ICD-10-CM | POA: Diagnosis present

## 2016-09-26 DIAGNOSIS — T43624A Poisoning by amphetamines, undetermined, initial encounter: Principal | ICD-10-CM

## 2016-09-26 DIAGNOSIS — S0990XD Unspecified injury of head, subsequent encounter: Secondary | ICD-10-CM | POA: Diagnosis not present

## 2016-09-26 DIAGNOSIS — I951 Orthostatic hypotension: Secondary | ICD-10-CM | POA: Diagnosis not present

## 2016-09-26 DIAGNOSIS — I452 Bifascicular block: Secondary | ICD-10-CM | POA: Insufficient documentation

## 2016-09-26 DIAGNOSIS — G90A Postural orthostatic tachycardia syndrome (POTS): Secondary | ICD-10-CM | POA: Diagnosis present

## 2016-09-26 HISTORY — DX: Orthostatic hypotension: I95.1

## 2016-09-26 HISTORY — DX: Postural orthostatic tachycardia syndrome (POTS): G90.A

## 2016-09-26 HISTORY — DX: Tachycardia, unspecified: R00.0

## 2016-09-26 HISTORY — DX: Other specified cardiac arrhythmias: I49.8

## 2016-09-26 LAB — CBC WITH DIFFERENTIAL/PLATELET
BASOS ABS: 0 10*3/uL (ref 0.0–0.1)
BASOS PCT: 0 %
EOS PCT: 1 %
Eosinophils Absolute: 0.1 10*3/uL (ref 0.0–0.7)
HEMATOCRIT: 38.9 % (ref 36.0–46.0)
Hemoglobin: 12.7 g/dL (ref 12.0–15.0)
LYMPHS PCT: 22 %
Lymphs Abs: 1.6 10*3/uL (ref 0.7–4.0)
MCH: 27.4 pg (ref 26.0–34.0)
MCHC: 32.6 g/dL (ref 30.0–36.0)
MCV: 83.8 fL (ref 78.0–100.0)
MONO ABS: 0.4 10*3/uL (ref 0.1–1.0)
MONOS PCT: 5 %
NEUTROS ABS: 5.2 10*3/uL (ref 1.7–7.7)
Neutrophils Relative %: 72 %
PLATELETS: 281 10*3/uL (ref 150–400)
RBC: 4.64 MIL/uL (ref 3.87–5.11)
RDW: 13.8 % (ref 11.5–15.5)
WBC: 7.3 10*3/uL (ref 4.0–10.5)

## 2016-09-26 LAB — URINALYSIS, ROUTINE W REFLEX MICROSCOPIC
Glucose, UA: NEGATIVE mg/dL
Hgb urine dipstick: NEGATIVE
KETONES UR: NEGATIVE mg/dL
LEUKOCYTES UA: NEGATIVE
NITRITE: NEGATIVE
PROTEIN: NEGATIVE mg/dL
Specific Gravity, Urine: 1.023 (ref 1.005–1.030)
pH: 5 (ref 5.0–8.0)

## 2016-09-26 LAB — RAPID URINE DRUG SCREEN, HOSP PERFORMED
AMPHETAMINES: POSITIVE — AB
BARBITURATES: NOT DETECTED
Benzodiazepines: NOT DETECTED
Cocaine: NOT DETECTED
Opiates: NOT DETECTED
TETRAHYDROCANNABINOL: NOT DETECTED

## 2016-09-26 LAB — COMPREHENSIVE METABOLIC PANEL
ALK PHOS: 60 U/L (ref 38–126)
ALT: 15 U/L (ref 14–54)
ANION GAP: 7 (ref 5–15)
AST: 20 U/L (ref 15–41)
Albumin: 4.6 g/dL (ref 3.5–5.0)
BUN: 18 mg/dL (ref 6–20)
CALCIUM: 8.9 mg/dL (ref 8.9–10.3)
CHLORIDE: 108 mmol/L (ref 101–111)
CO2: 25 mmol/L (ref 22–32)
Creatinine, Ser: 0.71 mg/dL (ref 0.44–1.00)
GFR calc non Af Amer: >60 mL/min (ref >60)
Glucose, Bld: 90 mg/dL (ref 65–99)
Potassium: 3.8 mmol/L (ref 3.5–5.1)
SODIUM: 140 mmol/L (ref 135–145)
TOTAL PROTEIN: 7.6 g/dL (ref 6.5–8.1)
Total Bilirubin: 0.6 mg/dL (ref 0.3–1.2)

## 2016-09-26 LAB — SALICYLATE LEVEL: Salicylate Lvl: <7 mg/dL (ref 2.8–30.0)

## 2016-09-26 LAB — HCG, QUANTITATIVE, PREGNANCY

## 2016-09-26 LAB — ETHANOL: ALCOHOL ETHYL (B): 65 mg/dL — AB (ref <5)

## 2016-09-26 LAB — ACETAMINOPHEN LEVEL: Acetaminophen (Tylenol), Serum: <10 ug/mL — ABNORMAL LOW (ref 10–30)

## 2016-09-26 LAB — HIV ANTIBODY (ROUTINE TESTING W REFLEX): HIV Screen 4th Generation wRfx: NONREACTIVE

## 2016-09-26 MED ORDER — ONDANSETRON HCL 4 MG/2ML IJ SOLN: 4.0000 mg | Freq: Four times a day (QID) | INTRAMUSCULAR | Status: DC | PRN

## 2016-09-26 MED ORDER — FLUDROCORTISONE ACETATE 0.1 MG PO TABS
0.1000 mg | ORAL_TABLET | Freq: Every day | ORAL | Status: DC
Start: 2016-09-26 — End: 2016-09-28
  Administered 2016-09-26 – 2016-09-28 (×3): 0.1 mg via ORAL
  Filled 2016-09-26 (×3): qty 1

## 2016-09-26 MED ORDER — ENOXAPARIN SODIUM 40 MG/0.4ML ~~LOC~~ SOLN
40.0000 mg | SUBCUTANEOUS | Status: DC
  Filled 2016-09-26 (×2): qty 0.4

## 2016-09-26 MED ORDER — LACTATED RINGERS IV SOLN
INTRAVENOUS | Status: DC
  Administered 2016-09-26 (×2): via INTRAVENOUS

## 2016-09-26 MED ORDER — NORETHINDRONE 0.35 MG PO TABS
1.0000 | ORAL_TABLET | Freq: Every day | ORAL | Status: DC
  Administered 2016-09-27 – 2016-09-28 (×2): 0.35 mg via ORAL

## 2016-09-26 MED ORDER — SODIUM CHLORIDE 0.9% FLUSH: 3.0000 mL | Freq: Two times a day (BID) | INTRAVENOUS | Status: DC

## 2016-09-26 MED ORDER — SODIUM CHLORIDE 0.9% FLUSH: 3.0000 mL | INTRAVENOUS | Status: DC | PRN

## 2016-09-26 MED ORDER — ONDANSETRON HCL 4 MG PO TABS
4.0000 mg | ORAL_TABLET | Freq: Four times a day (QID) | ORAL | Status: DC | PRN
  Administered 2016-09-26: 4 mg via ORAL

## 2016-09-26 MED ORDER — ACETAMINOPHEN 650 MG RE SUPP: 650.0000 mg | Freq: Four times a day (QID) | RECTAL | Status: DC | PRN

## 2016-09-26 MED ORDER — ACETAMINOPHEN 325 MG PO TABS
650.0000 mg | ORAL_TABLET | Freq: Four times a day (QID) | ORAL | Status: DC | PRN
  Administered 2016-09-27 (×2): 650 mg via ORAL
  Filled 2016-09-26 (×2): qty 2

## 2016-09-26 MED ORDER — SODIUM CHLORIDE 0.9% FLUSH
3.0000 mL | Freq: Two times a day (BID) | INTRAVENOUS | Status: DC
  Administered 2016-09-26 – 2016-09-27 (×2): 3 mL via INTRAVENOUS

## 2016-09-26 MED ORDER — SODIUM BICARBONATE 8.4 % IV SOLN
50.0000 meq | Freq: Once | INTRAVENOUS | Status: AC
  Administered 2016-09-26: 50 meq via INTRAVENOUS
  Filled 2016-09-26: qty 50

## 2016-09-26 MED ORDER — SODIUM CHLORIDE 0.9 % IV SOLN: 250.0000 mL | INTRAVENOUS | Status: DC | PRN
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
## 2016-09-26 NOTE — Progress Notes (Signed)
Per Eileen Stanford[place]enna at [facility] - if QRS is greater than 120, patient should receive 1-892mEq/kg Bicarb Push. She states given the overdose of propanolol, patient at a high risk for developing widened QRS. EKG/VS being obtained at this time. Patient remains stable and in no distress. [facility] can be reached at [phone]. Dr. Ella JubileeArrien notified.

## 2016-09-26 NOTE — Progress Notes (Signed)
Patient has denied SI/HI thoughts today. 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 States since her January concussion, she has increased difficulty managing deadlines and completing assignments. Reports that last week she pulled "three all nighters since Wednesday" in order to complete assignments. She states that this "negatively impacted her judgment" resulting in her taking the overdose last night. She states she regrets taking the overdose. Patient states she has not informed her parents (who reside in [place]) of her admission. Patient has been ambulating on unit and to bathroom with steady gait, reports headache 1/10 but refusing to take Tylenol. NO chest pain. NO cardiac arrhythmias or EKG changes. Decreased appetite, but able to eat 2 boiled eggs for lunch, drinking water. Pupils remain dilated at an 8, however, PEERLA. Patient anxiously awaiting evaluation by psychiatry.

## 2016-09-26 NOTE — ED Triage Notes (Addendum)
Took adderall  35 10 mg tabs, took about 30 propranolol 10 mg and drank red wine about half a bottle. After having fight with boyfriend about 0015 am.  No SI or HI voiced.

## 2016-09-26 NOTE — H&P (Addendum)
History and Physical    Hannah PresserMaia Danzy ZOX:[id number] DOB: 02/23/1996 DOA: 09/26/2016  PCP: No PCP Per Patient   Patient coming from: Home   Chief Complaint: Took a lot of propranolol and Adderall, drank wine, now concerned     HPI: Hannah Mathis is a 21 y.o. female with medical history significant for ADD, anxiety, postural orthostatic tachycardia syndrome, and postconcussive syndrome who presents the emergency department after taking large doses of propranolol and Adderall and drinking a half bottle of wine. Patient reports that she's been under a lot of stress recently related to her studies at [employer] which have been complicated by post concussive syndrome. She then had an argument with her boyfriend last night, wanted to "not feeling anything," and so she took approximately 50 tablets of 10 mg propranolol and approximately 35 tablets of 10 mg Adderall. She also drank approximately 1/2 bottle of wine. The ingestion was at approximately 12:30 AM. She expresses some regret and concern over the ingestion and presents to the emergency department for evaluation of this. She denies any intention to harm herself, denies hallucinations, and denies any homicidal ideations. She reports having a therapist with whom she receives cognitive behavioral therapy and reportedly has a good relationship with. Patient denies any chest pain or palpitations, denies dyspnea or cough, and denies headache, change in vision or hearing, loss of coordination, or focal numbness or weakness. She denies any recent illness.  ED Course: Upon arrival to the ED, patient is found to be afebrile, saturating well on room air, and with vital signs stable. EKG features a sinus rhythm with new right bundle branch block and left posterior fascicular block. Chemistry panel was entirely normal, as is CBC. UDS is positive for amphetamines only and urinalysis is only notable for moderate bilirubin. Ethanol level is 65 and acetaminophen and  salicylate levels are undetectable. Pregnancy test remains pending. Case was discussed with poison control and hospital observation on telemetry he was advised with no specific interventions needed at this time. Patient remained hemodynamically stable in the emergency department, is mentating appropriately, and is in no apparent respiratory distress. She has no complaints at this time and will be observed on telemetry for ongoing valuation and management propranolol and Adderall overdoses with mild alcohol intoxication.  Review of Systems:  All other systems reviewed and apart from HPI, are negative.  Past Medical History:  Diagnosis Date  . ADHD (attention deficit hyperactivity disorder)   . Anxiety   . Asthma    not active since childhood  . Depression     Past Surgical History:  Procedure Laterality Date  . WISDOM TOOTH EXTRACTION       reports that she has never smoked. She has never used smokeless tobacco. She reports that she drinks alcohol. She reports that she does not use drugs.  Allergies  Allergen Reactions  . Albuterol Anaphylaxis    Family History  Problem Relation Age of Onset  . Basal cell carcinoma Mother   . Healthy Father      Prior to Admission medications   Medication Sig Start Date End Date Taking? Authorizing Provider  amphetamine-dextroamphetamine (ADDERALL) 10 MG tablet Take 10 mg by mouth 3 (three) times daily. 07/16/16  Yes Historical Provider, MD  CAMILA 0.35 MG tablet Take 1 tablet by mouth daily. 09/17/16  Yes Historical Provider, MD  etonogestrel (NEXPLANON) 68 MG IMPL implant Inject into the skin.   Yes Historical Provider, MD  fludrocortisone (FLORINEF) 0.1 MG tablet Take 1 tablet (0.1  mg total) by mouth daily. 08/13/16  Yes Asa Lente, MD  ketoconazole (NIZORAL) 2 % shampoo Apply 1 application topically every 7 (seven) days. 09/16/16  Yes Historical Provider, MD  Omega-3 Fatty Acids (OMEGA 3 PO) Take 1 tablet by mouth daily.   Yes Historical  Provider, MD  OVER THE COUNTER MEDICATION Take 1 tablet by mouth 3 (three) times daily. IB Guard--takes after a meal   Yes Historical Provider, MD  Probiotic Product (ALIGN PO) Take 1 tablet by mouth daily.   Yes Historical Provider, MD    Physical Exam: Vitals:   09/26/16 0450 09/26/16 0500 09/26/16 0523 09/26/16 0554  BP: 105/71 102/67 102/67 99/69  Pulse: 66 66 68 72  Resp: 17 19 (!) 23 18  Temp:      TempSrc:      SpO2: 100% 100% 99% 99%      Constitutional: NAD, calm, comfortable Eyes: Markedly dilated pupils are equal and reactive. Lids and conjunctivae normal ENMT: Mucous membranes are moist. Posterior pharynx clear of any exudate or lesions.   Neck: normal, supple, no masses, no thyromegaly Respiratory: clear to auscultation bilaterally, no wheezing, no crackles. Normal respiratory effort.  Cardiovascular: S1 & S2 heard, regular rate and rhythm. No extremity edema. No significant JVD. Abdomen: No distension, no tenderness, no masses palpated. Bowel sounds normal.  Musculoskeletal: no clubbing / cyanosis. No joint deformity upper and lower extremities. Normal muscle tone.  Skin: no significant rashes, lesions, ulcers. Warm, dry, well-perfused. Neurologic: CN 2-12 grossly intact. Sensation intact, DTR normal. Strength 5/5 in all 4 limbs.  Psychiatric: Poor insight. Alert and oriented x 3. Normal mood and affect.     Labs on Admission: I have personally reviewed following labs and imaging studies  CBC:  Recent Labs Lab 09/26/16 0307  WBC 7.3  NEUTROABS 5.2  HGB 12.7  HCT 38.9  MCV 83.8  PLT 281   Basic Metabolic Panel:  Recent Labs Lab 09/26/16 0307  NA 140  K 3.8  CL 108  CO2 25  GLUCOSE 90  BUN 18  CREATININE 0.71  CALCIUM 8.9   GFR: CrCl cannot be calculated (Unknown ideal weight.). Liver Function Tests:  Recent Labs Lab 09/26/16 0307  AST 20  ALT 15  ALKPHOS 60  BILITOT 0.6  PROT 7.6  ALBUMIN 4.6   No results for input(s): LIPASE,  AMYLASE in the last 168 hours. No results for input(s): AMMONIA in the last 168 hours. Coagulation Profile: No results for input(s): INR, PROTIME in the last 168 hours. Cardiac Enzymes: No results for input(s): CKTOTAL, CKMB, CKMBINDEX, TROPONINI in the last 168 hours. BNP (last 3 results) No results for input(s): PROBNP in the last 8760 hours. HbA1C: No results for input(s): HGBA1C in the last 72 hours. CBG: No results for input(s): GLUCAP in the last 168 hours. Lipid Profile: No results for input(s): CHOL, HDL, LDLCALC, TRIG, CHOLHDL, LDLDIRECT in the last 72 hours. Thyroid Function Tests: No results for input(s): TSH, T4TOTAL, FREET4, T3FREE, THYROIDAB in the last 72 hours. Anemia Panel: No results for input(s): VITAMINB12, FOLATE, FERRITIN, TIBC, IRON, RETICCTPCT in the last 72 hours. Urine analysis:    Component Value Date/Time   COLORURINE YELLOW 09/26/2016 0325   APPEARANCEUR CLEAR 09/26/2016 0325   LABSPEC 1.023 09/26/2016 0325   PHURINE 5.0 09/26/2016 0325   GLUCOSEU NEGATIVE 09/26/2016 0325   HGBUR NEGATIVE 09/26/2016 0325   BILIRUBINUR MODERATE (A) 09/26/2016 0325   KETONESUR NEGATIVE 09/26/2016 0325   PROTEINUR NEGATIVE 09/26/2016 0325  NITRITE NEGATIVE 09/26/2016 0325   LEUKOCYTESUR NEGATIVE 09/26/2016 0325   Sepsis Labs: @LABRCNTIP (procalcitonin:4,lacticidven:4) )No results found for this or any previous visit (from the past 240 hour(s)).   Radiological Exams on Admission: No results found.  EKG: Independently reviewed. Sinus rhythm, RBBB and LPFB are new   Assessment/Plan  1. Overdose, amphetamine and beta-blocker  - Pt reports ingesting between 35 & 75 tabs of 10 mg propranolol and 35 tabs of 10 mg Adderall at ~12:30 am; she also had a 1/2 bottle wine  - She reports recent stressors involving college courses and post-concussive syndrome, then had an argument with her boyfriend immediately prior to the ingestion - Reports she "just didn't want to feel  anything," but denies any intention to harm herself; she also denies hallucinations or HI  - There has been no hypotension or bradycardia; EKG notable for new RBBB and LPFB  - Discussed with poison-control and was advised to observe for about 12 hours on telemetry; they will check back later and note that unless hypotension or bradycardia develops, she could likely be cleared from medical perspective this evening  - Social work is consulted   2. Post-concussive syndrome  - Pt suffered syncopal episode in mid-January, felt to be secondary to POTS, struck her head, and suffered concussion  - She is following with neurology for post-concussive syndrome and reports making slow progress; she had normal MRI brain 08/18/2016  - Does not appear to be acute issue    3. Postural orthostatic tachycardia syndrome  - Followed by neurology and treated with Florinef  - Appears to be stable, no recent syncope    4. ADD - Managed with Adderall - She took 35 tabs PTA as above and is being observed     DVT prophylaxis: sq Lovenox  Code Status: Full  Family Communication: Discussed with patient Disposition Plan: Observation Consults called: Poison Control Admission status: Observation    Briscoe Deutscher, MD [facility] Pager [phone]  If 7PM-7AM, please contact night-coverage [email or web address] Password [facility]  09/26/2016, 6:13 AM

## 2016-09-26 NOTE — ED Notes (Signed)
[facility]  Will call back for another update later.

## 2016-09-26 NOTE — ED Notes (Signed)
Poison control notified of ingestion and was advised that pt needed to be monitored for 6hr from time of ingestion and observe for any sedation. Also perform standard overdose protocol.

## 2016-09-26 NOTE — Progress Notes (Signed)
Tele Psych completed with recommendation for inpatient Behavioral Health. MD notified. Pt. Resting in room with boyfriend at bedside. Alert and oriented x 4, calm and pleasant, no distress noted.

## 2016-09-26 NOTE — ED Notes (Signed)
Dr.Glick at bedside with pt at this time.

## 2016-09-26 NOTE — Progress Notes (Signed)
Patient's mother called to inform staff that she will be flying down from [place] to be with the patient.

## 2016-09-26 NOTE — Progress Notes (Signed)
Dr. Arrien states patient does not need a sitter at this time, pending psych consult.  

## 2016-09-26 NOTE — Progress Notes (Signed)
PROGRESS NOTE    Hannah Mathis  RUE:[id number] DOB: 10/01/1995 DOA: 09/26/2016 PCP: No PCP Per Patient     Brief Narrative:  This is a 21 year old female who presented to the hospital with the chief complaint of overdose. Apparently she took large doses of propanolol and Adderall combined with liquor. She has been under increased psychological stress, she had an argument with her boyfriend and took approximately 50 tablets of 10 mg propanolol and approximately 35 tablets of 10 mg Adderall, with intentions of "not feeling anything". Posteriorly she took about half a bottle of wine. Denied any suicidal intention, hallucinations or homicidal intention. Denied any chest pain or palpitations. On physical examination blood pressure 1 11/12/1971, heart 66, respiratory 17, oxygen saturation 100%. Her pupils were markedly dilated, equal and reactive to light. Her oral mucosa was moist, her lungs were clear to auscultation, heart S1-S2 present rhythmic, her abdomen was soft, no lower extremities edema. Sodium 140, potassium 3.8, chloride 108, bicarbonate 25, glucose 90, BUN 18, creatinine 0.71, before meals 20, ALT 15, white count 7.3, hemoglobin 12.7, hematocrit 38.9, platelets 281. Urinalysis negative for infection, urinalysis positive for amphetamines. Alcohol level 65. EKG had a right axis deviation, sinus rhythm, prolongued QRS with a right bundle branch block morphology, 65 bpm. The patient was admitted to the observation unit working diagnosis of overdose, amphetamine and beta blocker.   Assessment & Plan:   Principal Problem:   Overdose, undetermined intent, initial encounter Active Problems:   Post concussion syndrome   ADD (attention deficit disorder)   POTS (postural orthostatic tachycardia syndrome)   Overdose  1. Overdose with amphetamines and beta blockers. The patient was admitted to the observation unit, she was placed on a telemetry monitor, no arrhythmias identified. Patient received IV  fluids. Poison control was contacted, recommendations for observation for 12 hours on telemetry. Even though denied being suicidal, definitely risky behavior, psychiatry will be consulted. 2 amps (50 meq) bicarbonate given due to prolonged qrs. Continue telemetry monitoring.   2. Postconcussive syndrome. Continue to follow-up as an outpatient with neurology.  3. Postural orthostatic tachycardia syndrome. Continue Florinef per her home regimen.  4. Attention deficit disorder. Resume Adderall per home regimen, at the time of discharge.     DVT prophylaxis: enoxaparin  Code Status:  Full  Family Communication: No family at the bedside  Disposition Plan: home   Consultants:   Psychiatry   Procedures:     Antimicrobials:       Subjective: Patient denies palpitation, no nausea or vomiting, no chest pain or dyspnea.   Objective: Vitals:   09/26/16 0554 09/26/16 0600 09/26/16 0650 09/26/16 1300  BP: 99/69 103/69 116/78 102/64  Pulse: 72 70 67 73  Resp: 18 18 18 18   Temp:   98.4 F (36.9 C) 98.4 F (36.9 C)  TempSrc:   Oral Oral  SpO2: 99% 98% 100%   Weight:   52.4 kg (115 lb 8.3 oz)   Height:   5\' 5"  (1.651 m)     Intake/Output Summary (Last 24 hours) at 09/26/16 1504 Last data filed at 09/26/16 1400  Gross per 24 hour  Intake              970 ml  Output                0 ml  Net              970 ml   Filed Weights   09/26/16 0650  Weight: 52.4  kg (115 lb 8.3 oz)    Examination:  General exam: Not in pain or dyspnea E ENT: no pallor or icterus, pupils dialted  Respiratory system: Clear to auscultation. Respiratory effort normal. No wheezing, rales or rhonchi.  Cardiovascular system: S1 & S2 heard, RRR. No JVD, murmurs, rubs, gallops or clicks. No pedal edema. Gastrointestinal system: Abdomen is nondistended, soft and nontender. No organomegaly or masses felt. Normal bowel sounds heard. Central nervous system: Alert and oriented. No focal neurological  deficits. Extremities: Symmetric 5 x 5 power. Skin: No rashes, lesions or ulcers     Data Reviewed: I have personally reviewed following labs and imaging studies  CBC:  Recent Labs Lab 09/26/16 0307  WBC 7.3  NEUTROABS 5.2  HGB 12.7  HCT 38.9  MCV 83.8  PLT 281   Basic Metabolic Panel:  Recent Labs Lab 09/26/16 0307  NA 140  K 3.8  CL 108  CO2 25  GLUCOSE 90  BUN 18  CREATININE 0.71  CALCIUM 8.9   GFR: Estimated Creatinine Clearance: 91.2 mL/min (by C-G formula based on SCr of 0.71 mg/dL). Liver Function Tests:  Recent Labs Lab 09/26/16 0307  AST 20  ALT 15  ALKPHOS 60  BILITOT 0.6  PROT 7.6  ALBUMIN 4.6   No results for input(s): LIPASE, AMYLASE in the last 168 hours. No results for input(s): AMMONIA in the last 168 hours. Coagulation Profile: No results for input(s): INR, PROTIME in the last 168 hours. Cardiac Enzymes: No results for input(s): CKTOTAL, CKMB, CKMBINDEX, TROPONINI in the last 168 hours. BNP (last 3 results) No results for input(s): PROBNP in the last 8760 hours. HbA1C: No results for input(s): HGBA1C in the last 72 hours. CBG: No results for input(s): GLUCAP in the last 168 hours. Lipid Profile: No results for input(s): CHOL, HDL, LDLCALC, TRIG, CHOLHDL, LDLDIRECT in the last 72 hours. Thyroid Function Tests: No results for input(s): TSH, T4TOTAL, FREET4, T3FREE, THYROIDAB in the last 72 hours. Anemia Panel: No results for input(s): VITAMINB12, FOLATE, FERRITIN, TIBC, IRON, RETICCTPCT in the last 72 hours. Sepsis Labs: No results for input(s): PROCALCITON, LATICACIDVEN in the last 168 hours.  No results found for this or any previous visit (from the past 240 hour(s)).       Radiology Studies: No results found.      Scheduled Meds: . enoxaparin (LOVENOX) injection  40 mg Subcutaneous Q24H  . fludrocortisone  0.1 mg Oral Daily  . norethindrone  1 tablet Oral Daily  . sodium chloride flush  3 mL Intravenous Q12H  .  sodium chloride flush  3 mL Intravenous Q12H   Continuous Infusions: . lactated ringers 50 mL/hr at 09/26/16 1003     LOS: 0 days      [name] Annett Gula, MD [facility] Pager [phone]  If 7PM-7AM, please contact night-coverage [email or web address] Password [facility] 09/26/2016, 3:04 PM

## 2016-09-26 NOTE — BH Assessment (Signed)
Tele Assessment Note   Hannah Mathis is an 21 y.o. female. [facility] and then transferred to 5 East floor. Pt is pleasant and oriented x 4. She denies her ingestion last night was a suicide attempt. Per chart review, pt had taken 50 pills of propranolol And 30 tablets of adderall with half a bottle of wine last night after an argument with her boyfriend. Pt denies hx of self harm or SI. She reports one inpatient MH admission when she was in high school. She endorses stress and being overwhelmed after recovering slowly from her 3rd concussion in mid Jan. She is in her junior year at [employer]. She reports she recently did 3 all nighters in a row to catch up on school work. Pt says sometimes she still has trouble with her concentration and memory d/t concussion. Pt denies homicidal thoughts or physical aggression. Pt denies having access to firearms. Pt denies having any legal problems at this time. Pt denies hallucinations. Pt does not appear to be responding to internal stimuli and exhibits no delusional thought. Pt's reality testing appears to be intact. Pt denies any current or past substance abuse problems. Pt does not appear to be intoxicated or in withdrawal at this time. Pt sts her paternal grandmother had MI. Pt reports insomnia since Jan concussion. She sts her mom is flying down from [place] tomorrow to be with her. Pt sts she has been seeing therapist Lauren (doesn't know last name) for CBT since concussion. Pt not sure of next appt  With Lauren. Pt reports frustration that it is taking so long for her to get over concussion. She is followed by a neurologist. Pt reports big stressor for her in childhood was [place]. Pt sts she transferred from [employer] to [employer].   Diagnosis: Major Depressive Disorder, Recurrent, Severe without Psychotic Features ADHD per history  Past Medical History:  Past Medical History:  Diagnosis Date  . ADHD  (attention deficit hyperactivity disorder)   . Anxiety   . Asthma    not active since childhood  . Depression     Past Surgical History:  Procedure Laterality Date  . WISDOM TOOTH EXTRACTION      Family History:  Family History  Problem Relation Age of Onset  . Basal cell carcinoma Mother   . Healthy Father     Social History:  reports that she has never smoked. She has never used smokeless tobacco. She reports that she drinks alcohol. She reports that she does not use drugs.  Additional Social History:  Alcohol / Drug Use Pain Medications: pt denies abuse - see pta meds list Prescriptions: pt denies abuse - see pta meds list Over the Counter: pt denies abuse - see pta meds list History of alcohol / drug use?: No history of alcohol / drug abuse (pt denies)  CIWA: CIWA-Ar BP: 106/74 Pulse Rate: 77 COWS:    PATIENT STRENGTHS: (choose at least two) Active sense of humor Average or above average intelligence Capable of independent living Communication skills Physical Health Supportive family/friends  Allergies:  Allergies  Allergen Reactions  . Albuterol Anaphylaxis    Home Medications:  Medications Prior to Admission  Medication Sig Dispense Refill  . amphetamine-dextroamphetamine (ADDERALL) 10 MG tablet Take 10 mg by mouth 3 (three) times daily.    Marland Kitchen. CAMILA 0.35 MG tablet Take 1 tablet by mouth daily.  12  . etonogestrel (NEXPLANON) 68 MG IMPL implant Inject  into the skin.    . fludrocortisone (FLORINEF) 0.1 MG tablet Take 1 tablet (0.1 mg total) by mouth daily. 30 tablet 5  . ketoconazole (NIZORAL) 2 % shampoo Apply 1 application topically every 7 (seven) days.  3  . Omega-3 Fatty Acids (OMEGA 3 PO) Take 1 tablet by mouth daily.    Marland Kitchen OVER THE COUNTER MEDICATION Take 1 tablet by mouth 3 (three) times daily. IB Guard--takes after a meal    . Probiotic Product (ALIGN PO) Take 1 tablet by mouth daily.      OB/GYN Status:  No LMP recorded. Patient has had an  implant.  General Assessment Data Location of Assessment: [facility] TTS Assessment: In system Is this a Tele or Face-to-Face Assessment?: Tele Assessment Is this an Initial Assessment or a Re-assessment for this encounter?: Initial Assessment Marital status: Long term relationship Juanell Fairly name: none Is patient pregnant?: Unknown Pregnancy Status: Unknown Living Arrangements:  (roommate) Can pt return to current living arrangement?: Yes Admission Status: Voluntary Is patient capable of signing voluntary admission?: Yes Referral Source: Self/Family/Friend Insurance type: blue cross     Crisis Care Plan Living Arrangements:  (roommate) Name of Psychiatrist: none Name of Therapist: Lauren  Education Status Is patient currently in school?: Yes Current Grade: 15 Name of school: [employer]  Risk to self with the past 6 months Suicidal Ideation: No Has patient been a risk to self within the past 6 months prior to admission? : Yes Suicidal Intent: No Has patient had any suicidal intent within the past 6 months prior to admission? :  (pt denies) Is patient at risk for suicide?: Yes Suicidal Plan?:  (pt denies plan) Has patient had any suicidal plan within the past 6 months prior to admission? : No Access to Means: No What has been your use of drugs/alcohol within the last 12 months?: pt reports rare use of etoh Previous Attempts/Gestures: No How many times?: 0 Other Self Harm Risks: none Triggers for Past Attempts:  (n/a) Intentional Self Injurious Behavior: None Family Suicide History: No (paternal grandmother had mental illness) Recent stressful life event(s): Other (Comment) (still getting over jan concussion) Persecutory voices/beliefs?: No Depression: No Depression Symptoms: Insomnia, Feeling angry/irritable (since concussion) Substance abuse history and/or treatment for substance abuse?: No Suicide prevention information given to non-admitted patients: Not applicable  Risk  to Others within the past 6 months Homicidal Ideation: No Does patient have any lifetime risk of violence toward others beyond the six months prior to admission? : No Thoughts of Harm to Others: No Current Homicidal Intent: No Current Homicidal Plan: No Access to Homicidal Means: No Identified Victim: none History of harm to others?: No Assessment of Violence: None Noted Violent Behavior Description: pt denies hx of violence Does patient have access to weapons?: No Criminal Charges Pending?: No Does patient have a court date: No Is patient on probation?: No  Psychosis Hallucinations:  (pt sts hallucinations for first time ever last night) Delusions: None noted  Mental Status Report Appearance/Hygiene: In hospital gown, Unremarkable Eye Contact: Good Motor Activity: Freedom of movement Speech: Logical/coherent Level of Consciousness: Alert Mood:  (overwhelmed, stressed) Affect: Appropriate to circumstance (euthymic) Anxiety Level: Moderate Thought Processes: Relevant, Coherent Judgement: Unimpaired Orientation: Person, Place, Time, Situation Obsessive Compulsive Thoughts/Behaviors: None  Cognitive Functioning Concentration: Decreased Memory: Remote Impaired, Recent Impaired IQ: Average Insight: Poor Impulse Control: Poor Appetite: Good Sleep: Decreased Total Hours of Sleep:  (pt sts insomnia since concussion) Vegetative Symptoms: None  ADLScreening [facility]) Patient's cognitive  ability adequate to safely complete daily activities?: Yes Patient able to express need for assistance with ADLs?: Yes Independently performs ADLs?: Yes (appropriate for developmental age)  Prior Inpatient Therapy Prior Inpatient Therapy: Yes Prior Therapy Dates: when pt in high school Prior Therapy Facilty/Provider(s): in [place] Reason for Treatment: SI, depression  Prior Outpatient Therapy Prior Outpatient Therapy: Yes Prior Therapy Dates: in the past and currently Prior  Therapy Facilty/Provider(s): Lauren - pt doesn't remember agency name Reason for Treatment: CBT after jan concussion Does patient have an ACCT team?: No Does patient have Intensive In-House Services?  : No Does patient have [facility] services? : No Does patient have P4CC services?: No  ADL Screening (condition at time of admission) Patient's cognitive ability adequate to safely complete daily activities?: Yes Is the patient deaf or have difficulty hearing?: No Does the patient have difficulty seeing, even when wearing glasses/contacts?: No Does the patient have difficulty concentrating, remembering, or making decisions?: Yes Patient able to express need for assistance with ADLs?: Yes Does the patient have difficulty dressing or bathing?: No Independently performs ADLs?: Yes (appropriate for developmental age) Does the patient have difficulty walking or climbing stairs?: No Weakness of Legs: None Weakness of Arms/Hands: None  Home Assistive Devices/Equipment Home Assistive Devices/Equipment: Contact lenses  Therapy Consults (therapy consults require a physician order) PT Evaluation Needed: No OT Evalulation Needed: No SLP Evaluation Needed: No Abuse/Neglect Assessment (Assessment to be complete while patient is alone) Physical Abuse: Denies Verbal Abuse: Denies Sexual Abuse: Denies Exploitation of patient/patient's resources: Denies Self-Neglect: Denies Values / Beliefs Cultural Requests During Hospitalization: None Spiritual Requests During Hospitalization: None Consults Spiritual Care Consult Needed: No Social Work Consult Needed: No Merchant navy officer (For Healthcare) Does Patient Have a Medical Advance Directive?: No Would patient like information on creating a medical advance directive?: No - Patient declined Nutrition Screen- [facility] Patient's home diet: Regular Has the patient recently lost weight without trying?: No Has the patient been eating poorly because of  a decreased appetite?: No Malnutrition Screening Tool Score: 0  Additional Information 1:1 In Past 12 Months?: No CIRT Risk: No Elopement Risk: No Does patient have medical clearance?: Yes     Disposition:  Disposition Initial Assessment Completed for this Encounter: Yes Disposition of Patient: Inpatient treatment program Type of inpatient treatment program: Adult (dr Jackquline Berlin recommended inpatient )   If patient attempts to leave hospital, pt will need to be placed under involuntary commitment.   Kailynn Satterly P 09/26/2016 7:01 PM

## 2016-09-26 NOTE — Progress Notes (Signed)
To 1 West to discuss psych consult for patient with Daphene Jaeger RN; Sherril Croon [facility] at [facility] and she stated that the doctor from [facility]- "Dr. Loni Muse" left at 72 and would not be coming back today to see the patient.  She requested for Dr. Cathlean Sauer to order a TTS consult to have the patient evaluated to determine if the patient met inpatient behavioral health criteria or not.  Dr. Cathlean Sauer notified and orders placed for admission and TTS consult.  Questioned Dr. Cathlean Sauer regarding patient need for sitter precautions.  Dr. Cathlean Sauer stated the patient did not need a sitter at this time.  We will re-evaluate pending patient behavior and TTS consult results.

## 2016-09-26 NOTE — Progress Notes (Signed)
Report to [facility], [place], to assume care of patient at transfer to [phone]. No distress. In stable condition at time of transfer. Patient taken via wheelchair by NT to 1503 with tele box in place.

## 2016-09-26 NOTE — Progress Notes (Signed)
Dr. Ella JubileeArrien states patient does not need a sitter at this time, pending psych consult.

## 2016-09-26 NOTE — Progress Notes (Signed)
Pt. Arrived to floor 5E via wheelchair from ED. [facility] transfer[facility]observation nurse, [facility], [facility]. is not on suicide precautions and no sitter needed.

## 2016-09-27 DIAGNOSIS — F419 Anxiety disorder, unspecified: Secondary | ICD-10-CM | POA: Diagnosis not present

## 2016-09-27 DIAGNOSIS — T447X4A Poisoning by beta-adrenoreceptor antagonists, undetermined, initial encounter: Secondary | ICD-10-CM | POA: Diagnosis not present

## 2016-09-27 DIAGNOSIS — T50904S Poisoning by unspecified drugs, medicaments and biological substances, undetermined, sequela: Secondary | ICD-10-CM

## 2016-09-27 DIAGNOSIS — T43624A Poisoning by amphetamines, undetermined, initial encounter: Secondary | ICD-10-CM | POA: Diagnosis not present

## 2016-09-27 DIAGNOSIS — F909 Attention-deficit hyperactivity disorder, unspecified type: Secondary | ICD-10-CM | POA: Diagnosis not present

## 2016-09-27 DIAGNOSIS — Z79899 Other long term (current) drug therapy: Secondary | ICD-10-CM | POA: Diagnosis not present

## 2016-09-27 DIAGNOSIS — F988 Other specified behavioral and emotional disorders with onset usually occurring in childhood and adolescence: Secondary | ICD-10-CM | POA: Diagnosis not present

## 2016-09-27 DIAGNOSIS — F0781 Postconcussional syndrome: Secondary | ICD-10-CM | POA: Diagnosis not present

## 2016-09-27 LAB — MAGNESIUM: Magnesium: 1.9 mg/dL (ref 1.7–2.4)

## 2016-09-27 LAB — CBC WITH DIFFERENTIAL/PLATELET
Basophils Absolute: 0 10*3/uL (ref 0.0–0.1)
Basophils Relative: 1 %
Eosinophils Absolute: 0.1 10*3/uL (ref 0.0–0.7)
Eosinophils Relative: 1 %
HEMATOCRIT: 37.2 % (ref 36.0–46.0)
HEMOGLOBIN: 12.4 g/dL (ref 12.0–15.0)
LYMPHS PCT: 55 %
Lymphs Abs: 3.3 10*3/uL (ref 0.7–4.0)
MCH: 27.5 pg (ref 26.0–34.0)
MCHC: 33.3 g/dL (ref 30.0–36.0)
MCV: 82.5 fL (ref 78.0–100.0)
MONOS PCT: 7 %
Monocytes Absolute: 0.4 10*3/uL (ref 0.1–1.0)
NEUTROS ABS: 2.2 10*3/uL (ref 1.7–7.7)
NEUTROS PCT: 36 %
Platelets: 223 10*3/uL (ref 150–400)
RBC: 4.51 MIL/uL (ref 3.87–5.11)
RDW: 13.7 % (ref 11.5–15.5)
WBC: 6.1 10*3/uL (ref 4.0–10.5)

## 2016-09-27 LAB — I-STAT BETA HCG BLOOD, ED (NOT ORDERABLE): HCG, QUANTITATIVE: 20 m[IU]/mL — AB (ref <5)

## 2016-09-27 LAB — GLUCOSE, CAPILLARY
Glucose-Capillary: 87 mg/dL (ref 65–99)
Glucose-Capillary: 126 mg/dL — ABNORMAL HIGH (ref 65–99)
Glucose-Capillary: 92 mg/dL (ref 65–99)

## 2016-09-27 MED ORDER — DIPHENHYDRAMINE HCL 25 MG PO CAPS
25.0000 mg | ORAL_CAPSULE | Freq: Three times a day (TID) | ORAL | Status: DC | PRN
  Administered 2016-09-27: 25 mg via ORAL
  Filled 2016-09-27: qty 1

## 2016-09-27 NOTE — Clinical Social Work Psych Note (Signed)
Clinical Social Worker Psych Service Line Progress Note  Clinical Social Worker: Lia Hopping, LCSW Date/Time: 09/27/2016, 9:59 AM   Review of Patient  Overall Medical Condition: Per physician patient is medically cleared, awaiting psychiatrist recommendations.  Participation Level:  Minimal Participation Quality: Appropriate Other Participation Quality:  Calm and Cooperative  Affect: Appropriate Cognitive: Appropriate Reaction to Medications/Concerns: No concerns presented at this time.   Modes of Intervention: Solution-focused, Exploration, Support   Summary of Progress/Plan at Discharge  Summary of Progress/Plan at Discharge: LCSWA met with patient at bedside, explained role and reason for consult. Patient is a Ship broker at [employer]. Patient reports over the past month she has been overwhelmed "life after having a concussion." Patient reports she has found it difficult to concentrate and keep up with her school work. Patient reports she had a argument with her boy friend about their lack of time spent together.  Patient reports due to her lack of sleep and late nights she felt impulsive, she took the medication to help with her stress, but denies intentional overdose.  Patient reports she has been going to [facility] therapy office for about a month to help with her stress. Patient reports she also takes acupuncture to help with her insomnia.  Patient reports she saw a tele psychiatrist yesterday but was not told about discharge plan. She assume she would be going home.  LCSWA informed patient the tele psychiatrist recommends inpatient  psychiatric placement. LCSWA called [facility] consult and patient is on list to be seen by a psychiatrist in person today. Patient understands the psychiatrist will help determine disposition.   Kathrin Greathouse, Latanya Presser, MSW Clinical Social Worker 5E and Psychiatric Service Line [phone] 09/27/2016  10:16 AM
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
## 2016-09-27 NOTE — Progress Notes (Signed)
Dr. Ella JubileeArrien states he is consulting psychiatry regarding patients care. AC updated.

## 2016-09-27 NOTE — Progress Notes (Signed)
Birth control medication retrieved from pharmacy and returned to patient. Pharmacy advised that birth control is kept at the bedside and the patient self administers according to their schedule.

## 2016-09-27 NOTE — Progress Notes (Signed)
PROGRESS NOTE    Hannah Mathis  ZOX:[id number] DOB: Nov 24, 1995 DOA: 09/26/2016 PCP: No PCP Per Patient    Brief Narrative:  This is a 21 year old female who presented to the hospital with the chief complaint of overdose. Apparently she took large doses of propanolol and Adderall combined with liquor. She has been under increased psychological stress, she had an argument with her boyfriend and took approximately 50 tablets of 10 mg propanolol and approximately 35 tablets of 10 mg Adderall, with intentions of "not feeling anything". Posteriorly she took about half a bottle of wine. Denied any suicidal intention, hallucinations or homicidal intention. Denied any chest pain or palpitations. On physical examination blood pressure 1 11/12/1971, heart 66, respiratory 17, oxygen saturation 100%. Her pupils were markedly dilated, equal and reactive to light. Her oral mucosa was moist, her lungs were clear to auscultation, heart S1-S2 present rhythmic, her abdomen was soft, no lower extremities edema. Sodium 140, potassium 3.8, chloride 108, bicarbonate 25, glucose 90, BUN 18, creatinine 0.71, before meals 20, ALT 15, white count 7.3, hemoglobin 12.7, hematocrit 38.9, platelets 281. Urinalysis negative for infection, urinalysis positive for amphetamines. Alcohol level 65. EKG had a right axis deviation, sinus rhythm, prolonguedQRS with a right bundle branch block morphology, 65 bpm. The patient was admitted to the observation unit working diagnosis ofoverdose, amphetamine and beta blocker.     Assessment & Plan:   Principal Problem:   Overdose, undetermined intent, initial encounter Active Problems:   Post concussion syndrome   ADD (attention deficit disorder)   POTS (postural orthostatic tachycardia syndrome)   Overdose   1. Overdose with amphetamines and beta blockers. Patient medically stable to be transferred to psych unit. I spoke with Dr Sharyne Peach personally, patient will need  inpatient psych  admission due to risk to herself. Needs safety monitoring and crisis management. Patient's mother at the bedside, requesting patient to be discharged, explained in detail recommendations from psych. At this point patient has been involuntary committed for inpatient psych.   2. Postconcussive syndrome.Continue to follow-up as an outpatient with neurology, will consult for inpatient evaluation.   3. Postural orthostatic tachycardia syndrome. Continue Florinef per her home regimen.  4. Attention deficit disorder. Resume Adderall per home regimen when OK per psychiatry.    DVT prophylaxis: enoxaparin  Code Status:  Full  Family Communication: No family at the bedside  Disposition Plan: home   Consultants:   Psychiatry   Procedures:     Antimicrobials   Subjective: Patient with no chest pain, no nausea or vomiting. No dyspnea.   Objective: Vitals:   09/26/16 1645 09/26/16 2207 09/27/16 0538 09/27/16 1412  BP: 106/74 125/79 130/77 113/82  Pulse: 77 (!) 57 (!) 57 70  Resp: 18 18 18 16   Temp: 98.3 F (36.8 C) 98.1 F (36.7 C) 98.1 F (36.7 C) 98.9 F (37.2 C)  TempSrc: Oral Oral Oral Oral  SpO2: 100% 100% 100% 98%  Weight: 50.4 kg (111 lb 1.8 oz)     Height: 5\' 5"  (1.651 m)       Intake/Output Summary (Last 24 hours) at 09/27/16 1827 Last data filed at 09/27/16 1800  Gross per 24 hour  Intake              770 ml  Output                0 ml  Net              770 ml   [employer]  09/26/16 0650 09/26/16 1645  Weight: 52.4 kg (115 lb 8.3 oz) 50.4 kg (111 lb 1.8 oz)    Examination:  General exam: Appears calm and comfortable  E ENT. No pallor or icterus.  Respiratory system: Clear to auscultation. Respiratory effort normal. Cardiovascular system: S1 & S2 heard, RRR. No JVD, murmurs, rubs, gallops or clicks. No pedal edema. Gastrointestinal system: Abdomen is nondistended, soft and nontender. No organomegaly or masses felt. Normal bowel sounds  heard. Central nervous system: Alert and oriented. No focal neurological deficits. Extremities: Symmetric 5 x 5 power. Skin: No rashes, lesions or ulcers     Data Reviewed: I have personally reviewed following labs and imaging studies  CBC:  Recent Labs Lab 09/26/16 0307 09/27/16 0544  WBC 7.3 6.1  NEUTROABS 5.2 2.2  HGB 12.7 12.4  HCT 38.9 37.2  MCV 83.8 82.5  PLT 281 223   Basic Metabolic Panel:  Recent Labs Lab 09/26/16 0307 09/27/16 0544  NA 140  --   K 3.8  --   CL 108  --   CO2 25  --   GLUCOSE 90  --   BUN 18  --   CREATININE 0.71  --   CALCIUM 8.9  --   MG  --  1.9   GFR: Estimated Creatinine Clearance: 87.8 mL/min (by C-G formula based on SCr of 0.71 mg/dL). Liver Function Tests:  Recent Labs Lab 09/26/16 0307  AST 20  ALT 15  ALKPHOS 60  BILITOT 0.6  PROT 7.6  ALBUMIN 4.6   No results for input(s): LIPASE, AMYLASE in the last 168 hours. No results for input(s): AMMONIA in the last 168 hours. Coagulation Profile: No results for input(s): INR, PROTIME in the last 168 hours. Cardiac Enzymes: No results for input(s): CKTOTAL, CKMB, CKMBINDEX, TROPONINI in the last 168 hours. BNP (last 3 results) No results for input(s): PROBNP in the last 8760 hours. HbA1C: No results for input(s): HGBA1C in the last 72 hours. CBG:  Recent Labs Lab 09/26/16 0655 09/26/16 0806 09/27/16 0735  GLUCAP 87 126* 92   Lipid Profile: No results for input(s): CHOL, HDL, LDLCALC, TRIG, CHOLHDL, LDLDIRECT in the last 72 hours. Thyroid Function Tests: No results for input(s): TSH, T4TOTAL, FREET4, T3FREE, THYROIDAB in the last 72 hours. Anemia Panel: No results for input(s): VITAMINB12, FOLATE, FERRITIN, TIBC, IRON, RETICCTPCT in the last 72 hours. Sepsis Labs: No results for input(s): PROCALCITON, LATICACIDVEN in the last 168 hours.  No results found for this or any previous visit (from the past 240 hour(s)).       Radiology Studies: No results  found.      Scheduled Meds: . enoxaparin (LOVENOX) injection  40 mg Subcutaneous Q24H  . fludrocortisone  0.1 mg Oral Daily  . norethindrone  1 tablet Oral Daily  . sodium chloride flush  3 mL Intravenous Q12H   Continuous Infusions:   LOS: 0 days      Tenee Wish Annett Gulaaniel Loreal Schuessler, MD [facility] Pager [phone]  If 7PM-7AM, please contact night-coverage [email or web address] Password [email or web address] 09/27/2016, 6:27 PM

## 2016-09-28 ENCOUNTER — Encounter (HOSPITAL_COMMUNITY): Payer: Self-pay

## 2016-09-28 ENCOUNTER — Ambulatory Visit: Payer: BLUE CROSS/BLUE SHIELD | Admitting: Neurology

## 2016-09-28 DIAGNOSIS — Z79899 Other long term (current) drug therapy: Secondary | ICD-10-CM | POA: Diagnosis not present

## 2016-09-28 DIAGNOSIS — F0781 Postconcussional syndrome: Secondary | ICD-10-CM | POA: Diagnosis not present

## 2016-09-28 DIAGNOSIS — T50904S Poisoning by unspecified drugs, medicaments and biological substances, undetermined, sequela: Secondary | ICD-10-CM | POA: Diagnosis not present

## 2016-09-28 DIAGNOSIS — F909 Attention-deficit hyperactivity disorder, unspecified type: Secondary | ICD-10-CM | POA: Diagnosis not present

## 2016-09-28 DIAGNOSIS — I498 Other specified cardiac arrhythmias: Secondary | ICD-10-CM

## 2016-09-28 DIAGNOSIS — T447X4A Poisoning by beta-adrenoreceptor antagonists, undetermined, initial encounter: Secondary | ICD-10-CM | POA: Diagnosis not present

## 2016-09-28 DIAGNOSIS — T43624A Poisoning by amphetamines, undetermined, initial encounter: Secondary | ICD-10-CM | POA: Diagnosis not present

## 2016-09-28 LAB — GLUCOSE, CAPILLARY: GLUCOSE-CAPILLARY: 74 mg/dL (ref 65–99)

## 2016-09-28 NOTE — Progress Notes (Signed)
LCSWA assisted physician with completing IVC. Magistrate received. Patient to be served.   Checked 9:00am-patient was served. Papers on chart.

## 2016-09-28 NOTE — Progress Notes (Signed)
Progress Note    Hannah Mathis  WUJ:[id number] DOB: 02/26/1996  DOA: 09/26/2016 PCP: No PCP Per Patient    Brief Narrative:   Chief complaint: F/U overdose  Medical records reviewed and are as summarized below:  Hannah Mathis is an 21 y.o. female with a PMH of ADD on Adderall, POTS on fludrocortisone, anxiety on propranolol and recent head trauma from a fall resulting in post concussive syndrome who was admitted 09/26/16 after ingesting approximately 35 Adderall tablets (10 mg) and 50 Propranolol tablets (10 mg) along with 1/2 bottle of wine.  She was brought to the ED by a friend after she became concerned that the overdose would cause her harm.  She was evaluated by tele psych and inpatient management was recommended.  She subsequently was placed under IVC after there were concerns 09/27/16 that the patient would leave AMA.  Assessment/Plan:   Principal Problem:   Overdose, undetermined intent Medically stable and cleared for discharge.  Long discussion held with the patient and her mother who confirm that there is a post discharge safety plan in place.  The patient already has a counselor who she will follow up with.  Additionally, spoke with the August Saucerean of students at [employer], Dr. Sylvester Harderodd Clark, who confirms that they are aware of the situation and have safety measures in place for [facility]. Safety sitter remains at bedside.  Active Problems:   Post concussion syndrome F/U with outpatient neurologist.    ADD (attention deficit disorder) Holding Adderall.    POTS (postural orthostatic tachycardia syndrome) Continue Florinef.     HIV screening The patient falls between the ages of 13-64 and should be screened for HIV, therefore HIV testing completed: Non-reactive.   Family Communication/Anticipated D/C date and plan/Code Status   DVT prophylaxis: Lovenox ordered. Code Status: Full Code.  Family Communication: Mother at bedside. Disposition Plan: Awaiting psych  re-evaluation.   Medical Consultants:    Psychiatry   Procedures:    None  Anti-Infectives:    None  Subjective:   Hannah Mathis feels that there has been miscommunication.  She feels that because she sought help on her own accord, she is not suicidal and therefore not a danger to herself.  She does not want to go to inpatient psych, and prefers to have her issues addressed through outpatient counseling including CBT. Denies active suicidal or homicidal intent at present.  Objective:    Vitals:   09/27/16 0538 09/27/16 1412 09/27/16 2147 09/28/16 0700  BP: 130/77 113/82 107/66 (!) 93/55  Pulse: (!) 57 70 74 60  Resp: 18 16 18 16   Temp: 98.1 F (36.7 C) 98.9 F (37.2 C) 98.5 F (36.9 C) 98 F (36.7 C)  TempSrc: Oral Oral Oral Oral  SpO2: 100% 98% 98% 100%  Weight:      Height:        Intake/Output Summary (Last 24 hours) at 09/28/16 0935 Last data filed at 09/27/16 1930  Gross per 24 hour  Intake              300 ml  Output                0 ml  Net              300 ml   Filed Weights   09/26/16 0650 09/26/16 1645  Weight: 52.4 kg (115 lb 8.3 oz) 50.4 kg (111 lb 1.8 oz)    Exam: General exam: Appears calm and comfortable.  Respiratory system:  Clear to auscultation. Respiratory effort normal. Cardiovascular system: S1 & S2 heard, RRR. No JVD,  rubs, gallops or clicks. No murmurs. Psychiatry: Judgement and insight appear mildly impaired. Mood & affect alternating between normal and tearful.   Data Reviewed:   I have personally reviewed following labs and imaging studies:  Labs: Basic Metabolic Panel:  Recent Labs Lab 09/26/16 0307 09/27/16 0544  NA 140  --   K 3.8  --   CL 108  --   CO2 25  --   GLUCOSE 90  --   BUN 18  --   CREATININE 0.71  --   CALCIUM 8.9  --   MG  --  1.9   GFR Estimated Creatinine Clearance: 87.8 mL/min (by C-G formula based on SCr of 0.71 mg/dL). Liver Function Tests:  Recent Labs Lab 09/26/16 0307  AST 20  ALT 15   ALKPHOS 60  BILITOT 0.6  PROT 7.6  ALBUMIN 4.6   CBC:  Recent Labs Lab 09/26/16 0307 09/27/16 0544  WBC 7.3 6.1  NEUTROABS 5.2 2.2  HGB 12.7 12.4  HCT 38.9 37.2  MCV 83.8 82.5  PLT 281 223   CBG:  Recent Labs Lab 09/26/16 0655 09/26/16 0806 09/27/16 0735 09/28/16 0748  GLUCAP 87 126* 92 74    Microbiology No results found for this or any previous visit (from the past 240 hour(s)).  Radiology: No results found.  Medications:   . enoxaparin (LOVENOX) injection  40 mg Subcutaneous Q24H  . fludrocortisone  0.1 mg Oral Daily  . norethindrone  1 tablet Oral Daily  . sodium chloride flush  3 mL Intravenous Q12H   Continuous Infusions:  > 35 minutes spent with the patient and her mother discussing her plan of care, treatment recommendations and coordinating care.   LOS: 0 days   Atwood Adcock  [facility] Pager [phone]. If unable to reach me by pager, please call my cell phone at [phone].  *Please refer to [email or web address], password [email or web address] to get updated schedule on who will round on this patient, as hospitalists switch teams weekly. If 7PM-7AM, please contact night-coverage at [email or web address], password [email or web address] for any overnight needs.  09/28/2016, 9:35 AM

## 2016-09-28 NOTE — Progress Notes (Signed)
Patient signed AMA paperwork.  All belongings returned to patient.  Patient left hospital with mother.

## 2016-09-28 NOTE — Discharge Instructions (Signed)
Drug Overdose  A drug overdose happens when you take too much of a drug. An overdose can occur with illegal drugs, prescription drugs, or over-the-counter (OTC) drugs.  The effects of a drug overdose can be mild, dangerous, or even deadly.  What are the causes?  This condition may be caused by:  · Taking too much of a drug by accident.  · Taking too much of a drug on purpose.  · An error made by a health care provider who prescribes a drug.  · An error made by the pharmacist who fills the prescription order.    Drugs that commonly cause overdose include:  · Mental health drugs.  · Pain medicines.  · Illegal drugs.  · OTC cough and cold medicines.  · Heart medicines.  · Seizure medicines.    What increases the risk?  A drug overdose is more likely in:  · Children. They may be attracted to colorful pills. Because of a child's small size, even a small amount of a drug can be dangerous.  · Elderly people. They may be taking many different drugs. Elderly people may have difficulty reading labels or remembering when they last took their medicine.    The risk of a drug overdose is also higher for someone who:  · Takes illegal drugs.  · Takes a drug and drinks alcohol.  · Has a mental health condition.    What are the signs or symptoms?  Symptoms of a drug overdose depend on the drug and the amount that was taken. Common danger signs include:  · Behavior changes.  · Sleepiness.  · Slowed breathing.  · Nausea and vomiting.  · Seizures.  · Changes in eye pupil size. The pupil may be very large or very small.    If there are signs and symptoms of very low blood pressure (shock) from an overdose, emergency treatment is required. These include:  · Cold and clammy skin.  · Pale skin.  · Blue lips.  · Very slow breathing.  · Extreme sleepiness.  · Loss of consciousness.    How is this diagnosed?  This condition may be diagnosed based on your symptoms. It is important to tell your health care provider:  · All of the drugs that you  took.  · When you took the drugs.  · Whether you were drinking alcohol.    Your health care provider will do a physical exam. This exam may include:  · Checking and monitoring your heart rate and rhythm, your temperature, and your blood pressure (vital signs).  · Checking your breathing and oxygen level.    You may also have tests, including:  · Urine tests to check for drugs in your system.  · Blood tests to check for:  ? Drugs in your system.  ? Signs of an imbalance of your blood minerals (electrolytes).  ? Liver damage.  ? Kidney damage.    How is this treated?  Supporting your vital signs and your breathing is the first step in treating a drug overdose. Treatment may also include:  · Giving fluids and electrolytes through an IV tube.  · Inserting a breathing tube (endotracheal tube) in your airway to help you breathe.  · Passing a tube through your nose and into your stomach (NG tube, or nasogastric tube) to wash out your stomach.  · Giving medicines that:  ? Make you vomit.  ? Absorb any medicine that is left in your digestive system.  ? Block   or reverse the effect of the drug that caused the overdose.  · Filtering your blood through an artificial kidney machine (hemodialysis). You may need this if your overdose is severe or if you have kidney failure.  · Ongoing counseling and mental health support if you intentionally overdosed or used an illegal drug.    Follow these instructions at home:  · Take medicines only as directed by your health care provider. Always ask your health care provider about possible side effects of any new drug that you start taking.  · Keep a list of all of the drugs that you take, including over-the-counter medicines. Bring this list with you to all of your medical visits.  · Drink enough fluid to keep your urine clear or pale yellow.  · Keep all follow-up visits as directed by your health care provider. This is important.  How is this prevented?  · Get help if you are struggling  with:  ? Alcohol or drug use.  ? Depression or another mental health problem.  · Keep the phone number of your local poison control center near your phone or on your cell phone.  · Store all medicines in safety containers that are out of the reach of children.  · Read the drug inserts that come with your medicines.  · Do not use illegal drugs.  · Do not drink alcohol when taking drugs.  · Do not take medicines that are not prescribed for you.  Contact a health care provider if:  · Your symptoms return.  · You develop new symptoms or side effects when you take medicines.  Get help right away if:  · You think that you or someone else may have taken too much of a drug. The hotline of the National Poison Control Center is (800) 222-1222.  · You or someone else is having symptoms of a drug overdose.  · You have serious thoughts about hurting yourself or others.  · You become confused.  · You have:  ? Chest pain.  ? Difficulty breathing.  ? A loss of consciousness.  Drug overdose is an emergency. Do not wait to see if the symptoms will go away. Get medical help right away. Call your local emergency services (911 in the [place] Do not drive yourself to the hospital.  This information is not intended to replace advice given to you by your health care provider. Make sure you discuss any questions you have with your health care provider.  Document Released: 11/12/2014 Document Revised: 11/07/2015 Document Reviewed: 07/03/2014  Elsevier Interactive Patient Education © 2017 [employer] Inc[employer]

## 2016-09-28 NOTE — Discharge Summary (Signed)
Physician Discharge Summary  Hannah Mathis:[id number] DOB: March 21, 1996 DOA: 09/26/2016  PCP: No PCP Per Patient  Admit date: 09/26/2016 Discharge date: 09/28/2016  Admitted From: Home Discharge disposition: Home   Recommendations for Outpatient Follow-Up:   1. The patient will follow up with her counselor.   Discharge Diagnosis:   Principal Problem:    Overdose, undetermined intent, initial encounter Active Problems:    Post concussion syndrome    ADD (attention deficit disorder)    POTS (postural orthostatic tachycardia syndrome)    Overdose  Discharge Condition: Improved.  Diet recommendation:  Regular.   History of Present Illness:   Hannah Mathis is an 21 y.o. female with a PMH of ADD on Adderall, POTS on fludrocortisone, anxiety on propranolol and recent head trauma from a fall resulting in post concussive syndrome who was admitted 09/26/16 after ingesting approximately 35 Adderall tablets (10 mg) and 50 Propranolol tablets (10 mg) along with 1/2 bottle of wine.  She was brought to the ED by a friend after she became concerned that the overdose would cause her harm.  She was evaluated by tele psych and inpatient management was recommended.  She subsequently was placed under IVC after there were concerns 09/27/16 that the patient would leave AMA.  Hospital Course by Problem:   Principal Problem:   Overdose, undetermined intent Medically stable and cleared for discharge.  Long discussion held with the patient and her mother who confirm that there is a post discharge safety plan in place.  The patient already has a counselor who she will follow up with.  Additionally, spoke with the August Saucer of students at [employer], Dr. Sylvester Harder, who confirms that they are aware of the situation and have safety measures in place for [facility]. The patient agreed to sign herself out against the psychiatrist's medical recommendations of inpatient treatment.  Active Problems:   Post  concussion syndrome F/U with outpatient neurologist.    ADD (attention deficit disorder) Holding Adderall.    POTS (postural orthostatic tachycardia syndrome) Continue Florinef.     HIV screening The patient falls between the ages of 13-64 and should be screened for HIV, therefore HIV testing completed: Non-reactive    Medical Consultants:    Psychiatry: Leata Mouse, MD   Discharge Exam:   Vitals:   09/27/16 2147 09/28/16 0700  BP: 107/66 (!) 93/55  Pulse: 74 60  Resp: 18 16  Temp: 98.5 F (36.9 C) 98 F (36.7 C)   Vitals:   09/27/16 0538 09/27/16 1412 09/27/16 2147 09/28/16 0700  BP: 130/77 113/82 107/66 (!) 93/55  Pulse: (!) 57 70 74 60  Resp: 18 16 18 16   Temp: 98.1 F (36.7 C) 98.9 F (37.2 C) 98.5 F (36.9 C) 98 F (36.7 C)  TempSrc: Oral Oral Oral Oral  SpO2: 100% 98% 98% 100%  Weight:      Height:        General exam: Appears calm and comfortable.  Respiratory system: Clear to auscultation. Respiratory effort normal. Cardiovascular system: S1 & S2 heard, RRR. No JVD,  rubs, gallops or clicks. No murmurs. Psychiatry: Judgement and insight appear mildly impaired. Mood & affect alternating between normal and tearful.    The results of significant diagnostics from this hospitalization (including imaging, microbiology, ancillary and laboratory) are listed below for reference.     Procedures and Diagnostic Studies:   No results found.   Labs:   Basic Metabolic Panel:  Recent Labs Lab 09/26/16 0307 09/27/16 0544  NA 140  --  K 3.8  --   CL 108  --   CO2 25  --   GLUCOSE 90  --   BUN 18  --   CREATININE 0.71  --   CALCIUM 8.9  --   MG  --  1.9   GFR Estimated Creatinine Clearance: 87.8 mL/min (by C-G formula based on SCr of 0.71 mg/dL). Liver Function Tests:  Recent Labs Lab 09/26/16 0307  AST 20  ALT 15  ALKPHOS 60  BILITOT 0.6  PROT 7.6  ALBUMIN 4.6   No results for input(s): LIPASE, AMYLASE in the last  168 hours. No results for input(s): AMMONIA in the last 168 hours. Coagulation profile No results for input(s): INR, PROTIME in the last 168 hours.  CBC:  Recent Labs Lab 09/26/16 0307 09/27/16 0544  WBC 7.3 6.1  NEUTROABS 5.2 2.2  HGB 12.7 12.4  HCT 38.9 37.2  MCV 83.8 82.5  PLT 281 223   Cardiac Enzymes: No results for input(s): CKTOTAL, CKMB, CKMBINDEX, TROPONINI in the last 168 hours. BNP: Invalid input(s): POCBNP CBG:  Recent Labs Lab 09/26/16 0655 09/26/16 0806 09/27/16 0735 09/28/16 0748  GLUCAP 87 126* 92 74   D-Dimer No results for input(s): DDIMER in the last 72 hours. Hgb A1c No results for input(s): HGBA1C in the last 72 hours. Lipid Profile No results for input(s): CHOL, HDL, LDLCALC, TRIG, CHOLHDL, LDLDIRECT in the last 72 hours. Thyroid function studies No results for input(s): TSH, T4TOTAL, T3FREE, THYROIDAB in the last 72 hours.  Invalid input(s): FREET3 Anemia work up No results for input(s): VITAMINB12, FOLATE, FERRITIN, TIBC, IRON, RETICCTPCT in the last 72 hours. Microbiology No results found for this or any previous visit (from the past 240 hour(s)).   Discharge Instructions:   Discharge Instructions    Diet - low sodium heart healthy    Complete by:  As directed    Discharge instructions    Complete by:  As directed    Please follow with primary care in 7 days.   Follow up with your counselor at [facility] and go over your safety plan.  Make sure your plan specifies who to call if you start feeling impulsive or suicidal.  Do not drink alcohol, as alcohol is a depressant that can contribute to depression and impulsivity.   Increase activity slowly    Complete by:  As directed      Allergies as of 09/28/2016      Reactions   Albuterol Anaphylaxis      Medication List    STOP taking these medications   amphetamine-dextroamphetamine 10 MG tablet Commonly known as:  ADDERALL     TAKE these medications   ALIGN PO Take 1 tablet  by mouth daily.   CAMILA 0.35 MG tablet Generic drug:  norethindrone Take 1 tablet by mouth daily.   etonogestrel 68 MG Impl implant Commonly known as:  NEXPLANON Inject into the skin.   fludrocortisone 0.1 MG tablet Commonly known as:  FLORINEF Take 1 tablet (0.1 mg total) by mouth daily.   ketoconazole 2 % shampoo Commonly known as:  NIZORAL Apply 1 application topically every 7 (seven) days.   OMEGA 3 PO Take 1 tablet by mouth daily.   OVER THE COUNTER MEDICATION Take 1 tablet by mouth 3 (three) times daily. IB Guard--takes after a meal      Follow-up Information    Counseling office at [facility]. Schedule an appointment as soon as possible for a visit on 09/28/2016.  Time coordinating discharge: >35 minutes.  Signed:  Sigismund Cross  Pager [phone] [facility] 09/28/2016, 2:20 PM
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
## 2016-09-28 NOTE — Progress Notes (Addendum)
9:00am LCSWA met with patient, patient mother and Dr. Rockne Menghini at bedside. LCSWA discussed patient and mother concerns about miscommunication during hospital stay. Patient express she would like to have known her options and the tele psychiatrist recommendations after the visit was completed. Patient was seen by Psychiatrist on 3/20 and recommendations was inpatient. Patient denies she is SI/HI. LCSWA provided emotional support.  Plan is for psychiatrist to see patient again today.  LCSWA will follow up with recommendations.   1:30 pm Patient and patient mother have decided to leave AMA. The patient and mother report her plan is to follow up with [facility] therapist and counseling center, to create a safety plan. Patient first appointment is at 4:00 pm today.Patient reports she plans to continue with her CBT therapist and acupuncture. Patient mother plans to stay and assist with the safety plan and transition.  Patient presents no other concerns at this time.   IVC rescinded and faxed to [facility].   Kathrin Greathouse, Latanya Presser, MSW Clinical Social Worker 5E and Psychiatric Service Line [phone] 09/28/2016  1:35 PM

## 2016-09-28 NOTE — Progress Notes (Addendum)
Chaplain responded to a consult to meet with this patient.  Hannah Mathis is a warm young lady whom I had pleasure in meeting this morning along with her mother, Hannah Mathis.   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  She spoke of enjoying the comfort of her dog right now.   When asked if there was anything that she wanted to talk about, Hannah Mathis said not really and Faith her mom said that she has lots of people holding her in the light.  Quakers don't really pray but they hold you in the light. I shared that I would also hold her in the light and expressed my appreciation for her chatting with me.  Chaplain informed Hannah Mathis that we had Pam DrownQuaker Chaplain's and I could have one visit with her.  Hannah Mathis declined.      09/28/16 1021  Clinical Encounter Type  Visited With Patient;Family  Visit Type Initial;Psychological support;Spiritual support  Referral From Physician   Beryl Meageratina G. Nickisha Hum, Chaplain

## 2016-09-28 NOTE — Progress Notes (Signed)
Date: September 28, 2016 Discharge orders review for case management needs.  None found Marcelle Smilinghonda Davis, BSN, [place]3, [place]M: [phone]

## 2016-10-07 ENCOUNTER — Ambulatory Visit: Payer: BLUE CROSS/BLUE SHIELD | Admitting: Sports Medicine

## 2018-09-12 IMAGING — CT CT HEAD W/O CM
3 of 4 series · 15 of 47 positions shown, 18 images · non-contrast
Comparison: None.

CLINICAL DATA: Syncope with head injury.  Dizziness and nausea.

EXAM:
CT HEAD WITHOUT CONTRAST
TECHNIQUE: Contiguous axial images were obtained from the base of the skull
through the vertex without intravenous contrast.

[Series 2: head w/o · axial · non-contrast · 0.38mm/px · z∈[-182,-62]mm · 9 of 29 slices shown, 12 images]
[im 3/29  brain]
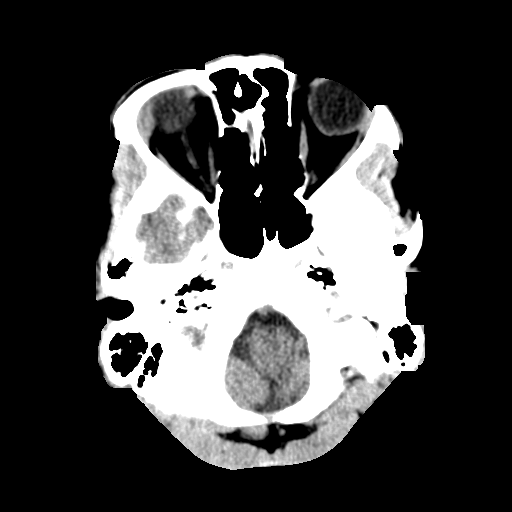
[im 3/29  bone]
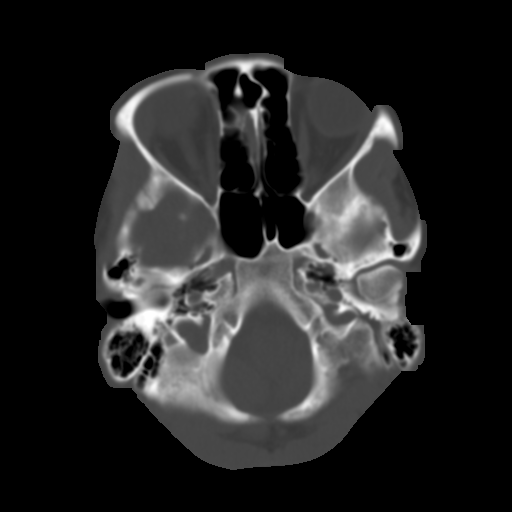
[im 7/29  brain]
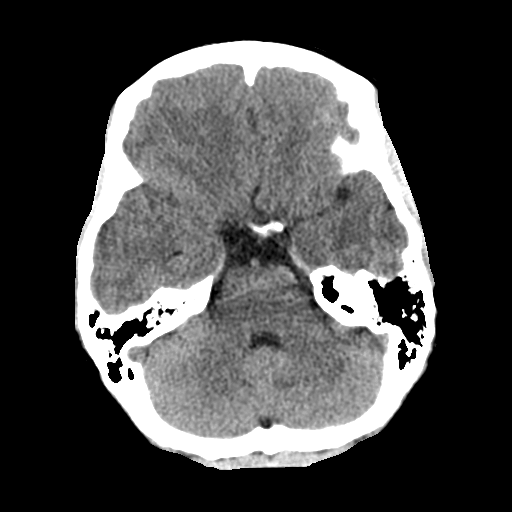
[im 9/29  brain]
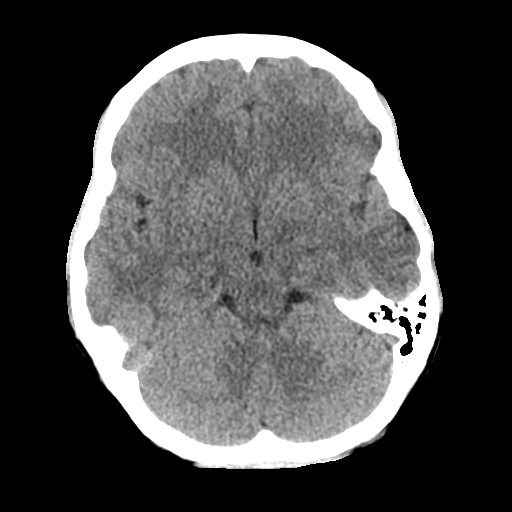
[im 13/29  brain]
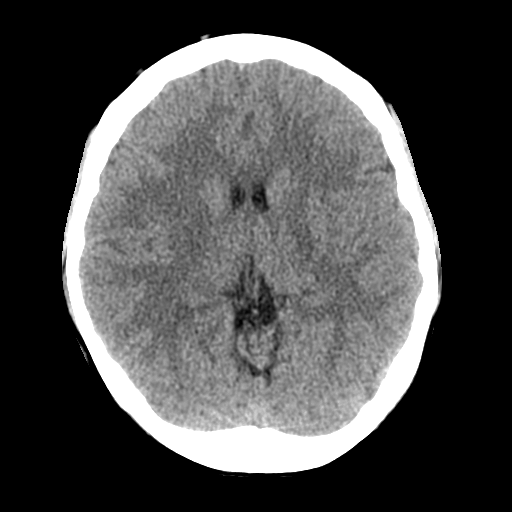
[im 15/29  brain]
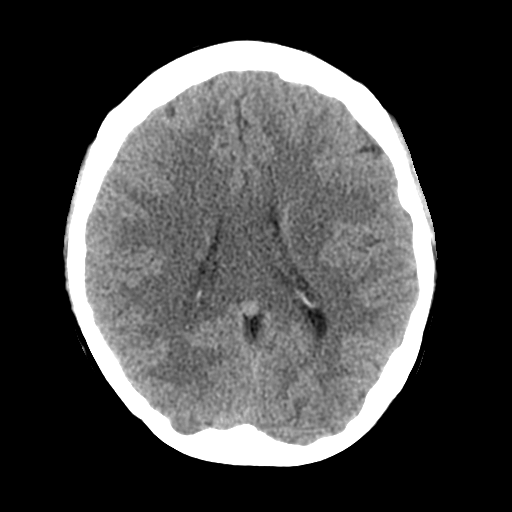
[im 15/29  bone]
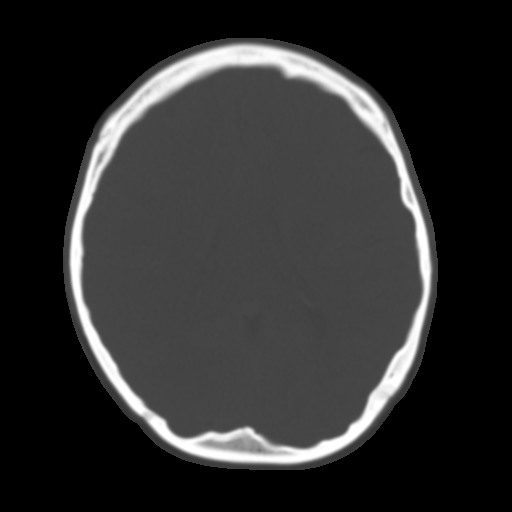
[im 17/29  brain]
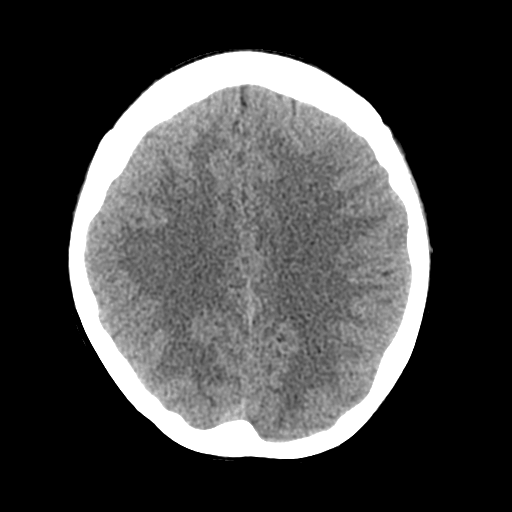
[im 21/29  brain]
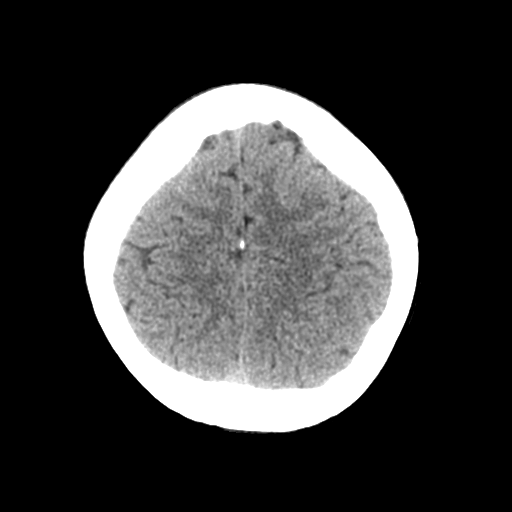
[im 23/29  brain]
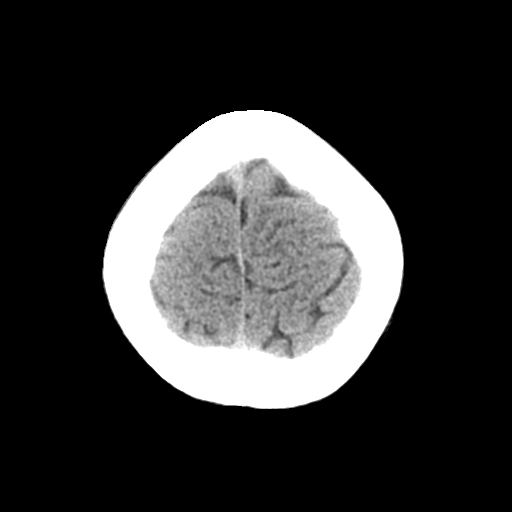
[im 27/29  brain]
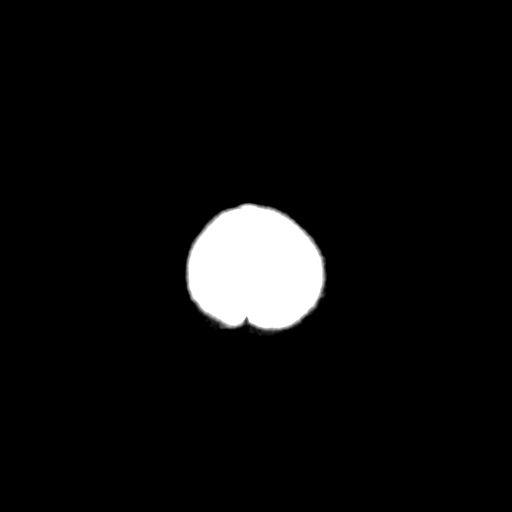
[im 27/29  bone]
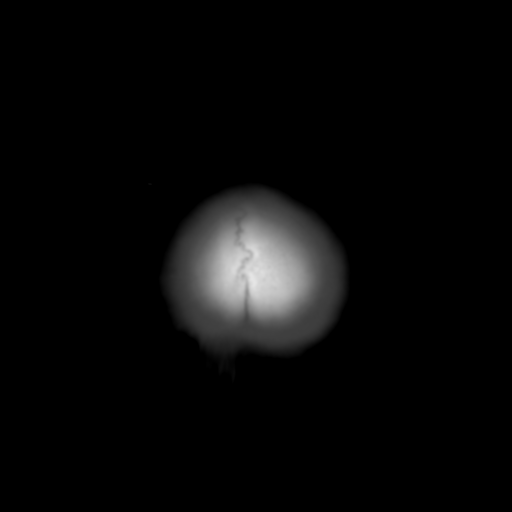

[Series 5: coronal · coronal · 0.27mm/px · 3 of 62 slices shown]
[im 21/62  brain]
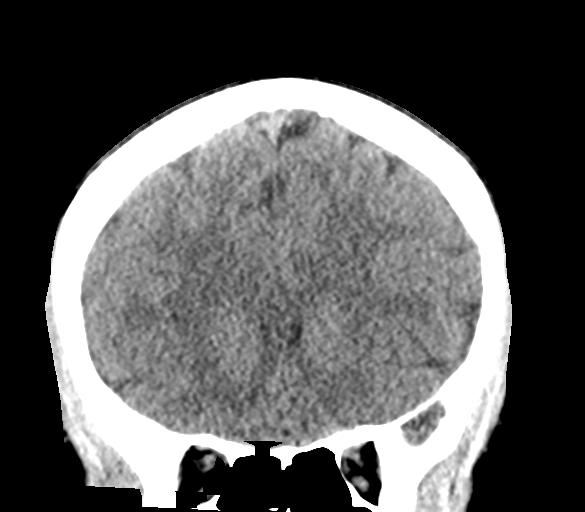
[im 28/62  brain]
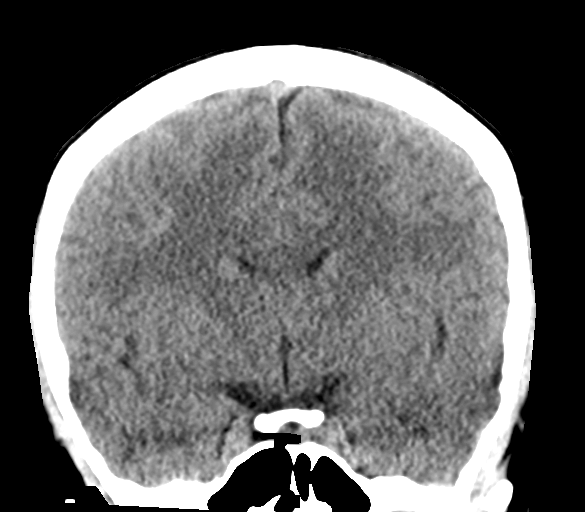
[im 34/62  brain]
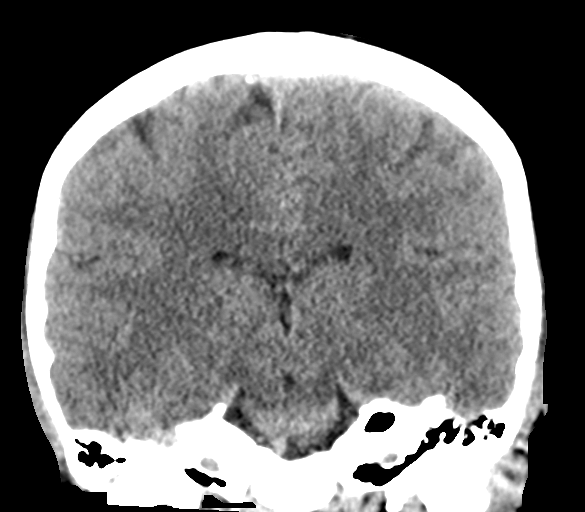

[Series 6: sagittal · sagittal · 0.26mm/px · 3 of 54 slices shown]
[im 18/54  brain]
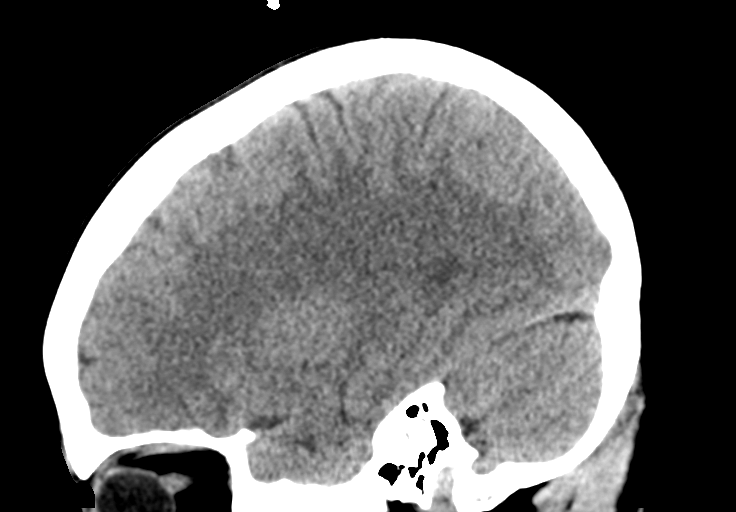
[im 27/54  brain]
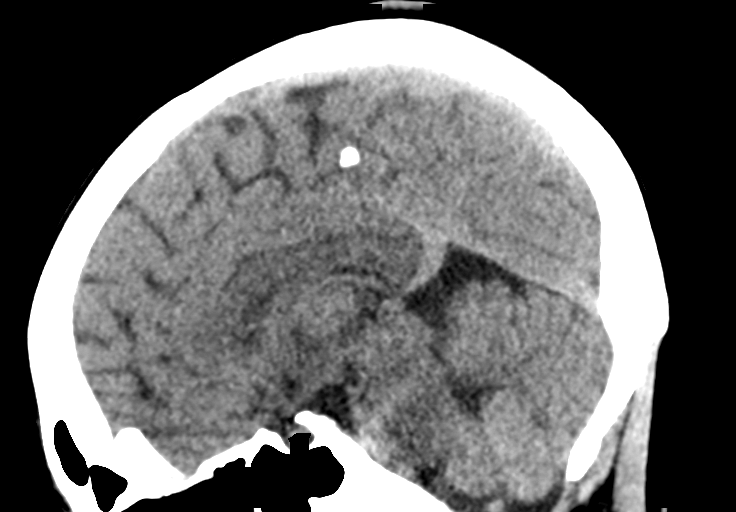
[im 36/54  brain]
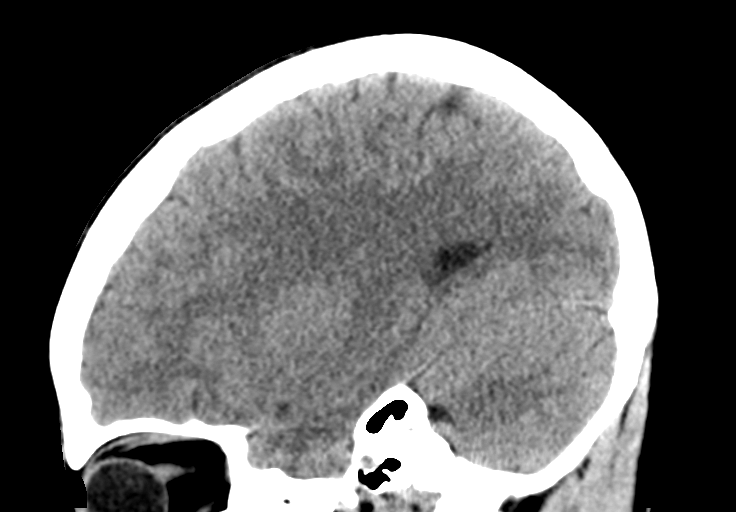

[15 of 47 positions shown; findings below may reference images not displayed]

FINDINGS: Brain: No evidence of acute infarction, hemorrhage, hydrocephalus,
extra-axial collection or mass lesion/mass effect.

Vascular: No hyperdense vessel or unexpected calcification.

Skull: Normal. Negative for fracture or focal lesion.

Sinuses/Orbits: Paranasal sinuses and mastoid air cells are clear.
The visualized orbits are unremarkable.

Other: None.
IMPRESSION: No acute intracranial abnormality.

## 2018-09-12 IMAGING — CR DG CHEST 2V
2 series · 2 of 2 positions shown · non-contrast
Comparison: None.

CLINICAL DATA: Syncopal episode

EXAM:
CHEST  2 VIEW

[w chest pa]
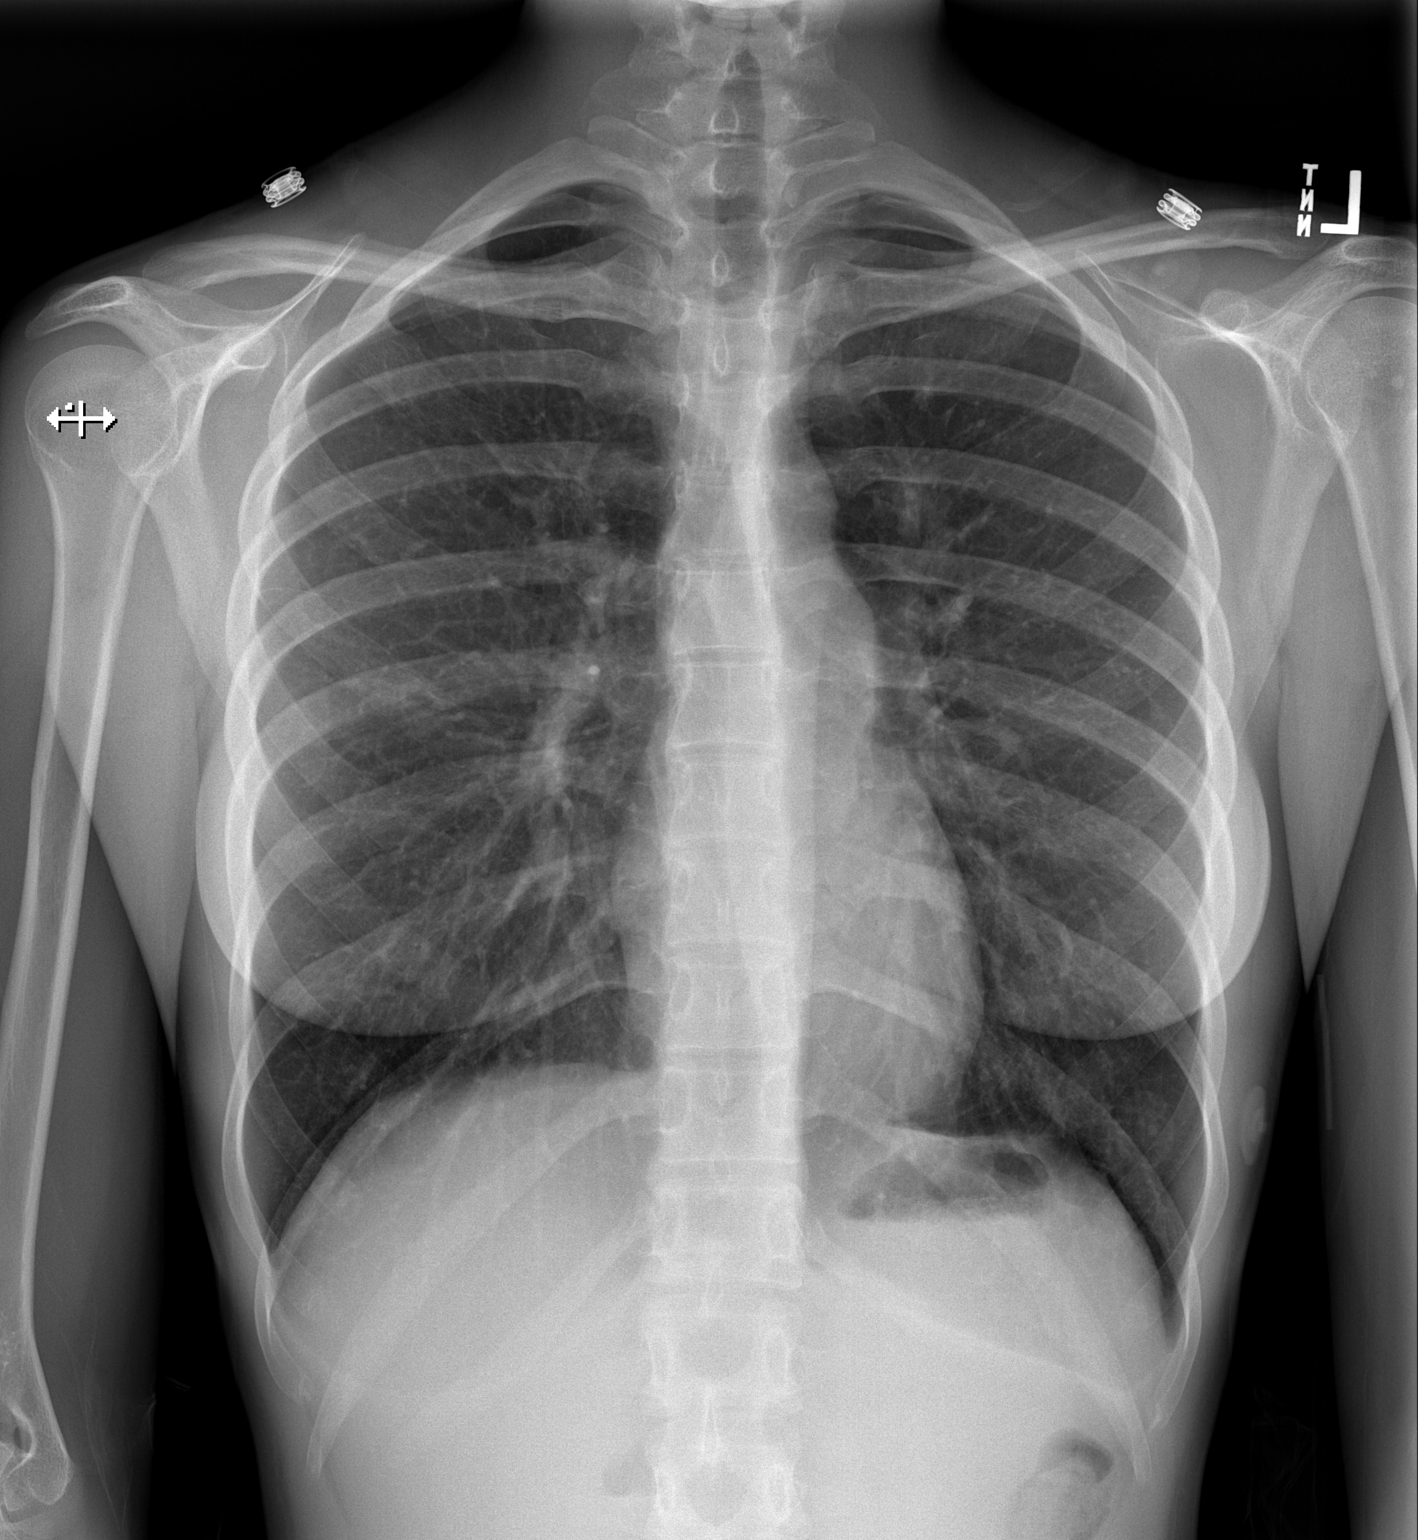

[w chest lat]
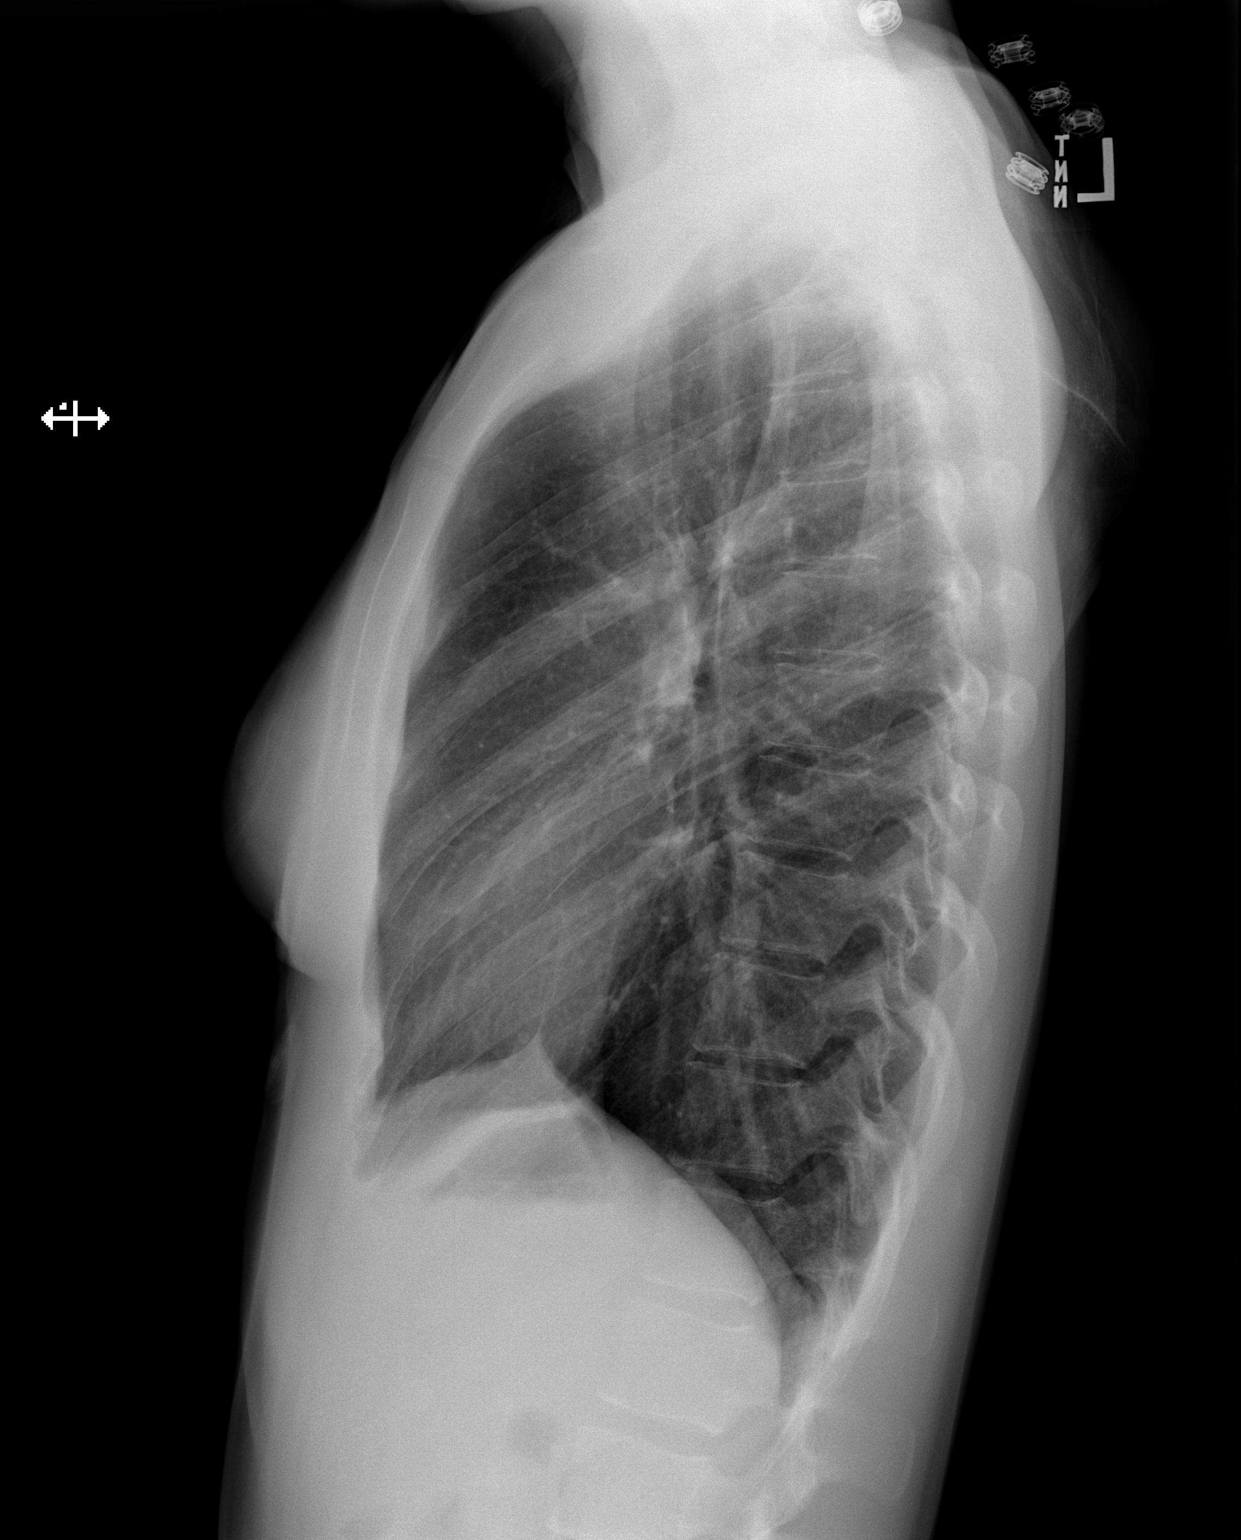

[2 of 2 positions shown; findings below may reference images not displayed]

FINDINGS: The heart size and mediastinal contours are within normal limits.
Both lungs are clear. The visualized skeletal structures are
unremarkable.
IMPRESSION: No active cardiopulmonary disease.

## 2019-04-24 ENCOUNTER — Other Ambulatory Visit: Payer: Self-pay

## 2019-04-24 DIAGNOSIS — Z20822 Contact with and (suspected) exposure to covid-19: Secondary | ICD-10-CM

## 2019-04-26 LAB — NOVEL CORONAVIRUS, NAA: SARS-CoV-2, NAA: NOT DETECTED

## 2020-03-22 ENCOUNTER — Other Ambulatory Visit: Payer: Self-pay

## 2020-03-22 ENCOUNTER — Emergency Department (HOSPITAL_BASED_OUTPATIENT_CLINIC_OR_DEPARTMENT_OTHER)
Admission: EM | Admit: 2020-03-22 | Discharge: 2020-03-22 | Disposition: A | Discharge status: Home / Self Care | Payer: No Typology Code available for payment source | Attending: Emergency Medicine | Admitting: Emergency Medicine

## 2020-03-22 ENCOUNTER — Encounter (HOSPITAL_BASED_OUTPATIENT_CLINIC_OR_DEPARTMENT_OTHER): Payer: Self-pay

## 2020-03-22 DIAGNOSIS — F909 Attention-deficit hyperactivity disorder, unspecified type: Secondary | ICD-10-CM | POA: Diagnosis not present

## 2020-03-22 DIAGNOSIS — Y9389 Activity, other specified: Secondary | ICD-10-CM | POA: Insufficient documentation

## 2020-03-22 DIAGNOSIS — Y9269 Other specified industrial and construction area as the place of occurrence of the external cause: Secondary | ICD-10-CM | POA: Diagnosis not present

## 2020-03-22 DIAGNOSIS — Y990 Civilian activity done for income or pay: Secondary | ICD-10-CM | POA: Diagnosis not present

## 2020-03-22 DIAGNOSIS — W228XXA Striking against or struck by other objects, initial encounter: Secondary | ICD-10-CM | POA: Diagnosis not present

## 2020-03-22 DIAGNOSIS — S060X0A Concussion without loss of consciousness, initial encounter: Secondary | ICD-10-CM | POA: Diagnosis not present

## 2020-03-22 DIAGNOSIS — Z79899 Other long term (current) drug therapy: Secondary | ICD-10-CM | POA: Diagnosis not present

## 2020-03-22 DIAGNOSIS — J45909 Unspecified asthma, uncomplicated: Secondary | ICD-10-CM | POA: Insufficient documentation

## 2020-03-22 DIAGNOSIS — S0990XA Unspecified injury of head, initial encounter: Secondary | ICD-10-CM | POA: Diagnosis present

## 2020-03-22 LAB — PREGNANCY, URINE: Preg Test, Ur: NEGATIVE

## 2020-03-22 MED ORDER — IBUPROFEN 400 MG PO TABS
600.0000 mg | ORAL_TABLET | Freq: Once | ORAL | Status: AC
  Administered 2020-03-22: 600 mg via ORAL
  Filled 2020-03-22: qty 1

## 2020-03-22 MED ORDER — ACETAMINOPHEN 500 MG PO TABS
500.0000 mg | ORAL_TABLET | Freq: Once | ORAL | Status: AC
  Administered 2020-03-22: 500 mg via ORAL
  Filled 2020-03-22: qty 1

## 2020-03-22 NOTE — Discharge Instructions (Addendum)
You were seen in the emergency department today following a head injury.  We suspect that you have a concussion, otherwise known and as a mild traumatic brain injury.    1. Medications: Ibuprofen or Tylenol for pain 2. Treatment: Rest, ice on head.  Concussion precautions given - keep patient in a quiet, not simulating, dark environment. No TV, computer use, video games until headache is resolved completely. No contact sports until cleared by the primary care provider or pediatrician. 3. Follow Up: With primary care physician in 2-3 days if headache persists.  Return to the emergency department if patient becomes lethargic, begins vomiting , develops double vision, speech difficulty, problems walking or other change in mental status.  We would like you to follow-up with the [facility], contact information below:  Address: [address] [place], [place]  Phone: [phone]  Per [facility] Website:   What to Expect: Evaluations at the [facility] Clinic All patients at the [facility] are given an extensive three-part evaluation that includes: a computerized test to measure memory, visual processing speed, and reaction time a test that measures the systems that integrate movement, balance, and vision an in-depth review of a detailed symptoms checklist for signs of concussion The evaluation process is critical for the treatment and recovery of a concussed patient as no two concussions are alike. Thankfully, the diagnostic tools that trained professionals use can help to better manage head injuries.  Part of the technology the doctors and staff at [facility] use in their assessments is a computerized examination called ImPACT. This tool uses six tasks to measure memory, visual processing speed, and reaction time. By analyzing the results of the examination and comparing them to average responses or a baseline score for a  patient, our staff can make an informed judgment about the patient's cognitive functions. In addition to the ImPACT examination, patients are given a Vestibular Ocular Motor Screening test. This is a simple and painless test that focuses on the systems that integrate a patient's movement, balance, and vision.  These tests are used in conjunction with a thorough review of a detailed symptoms checklist to complete the patient's evaluation and develop a treatment plan.    Further ED Instructions:   Please call and follow-up within the concussion clinic as well as your primary care provider within the next 3 to 5 days.  In the meantime we would like you to avoid strenuous/over exertional activities such as sports or running.  Please avoid excess screen time utilizing cell phones, computers, or the TV.  Please avoid activities that require significant amount of concentration.  Please try to rest as much as possible.  Please take Tylenol and/or Motrin per over-the-counter dosing instructions for any continued discomfort.  Return to the ER for new or worsening symptoms or any other concerns that you may have.

## 2020-03-22 NOTE — ED Provider Notes (Signed)
[facility] EMERGENCY DEPARTMENT Provider Note   CSN: [id number] Arrival date & time: 03/22/20  1128     History Chief Complaint  Patient presents with  . Head Injury    Hannah Mathis is a 24 y.o. female with past medical history significant for POTS, anxiety, asthma, ADHD.  HPI Patient presents to emergency department today with chief complaint of head injury x1 day ago.  She states she was at work and there are multiple refrigerators in a row next to each other. She was bent down to get milk out of one refrigerator when the door of the refrigerator next to when she was in was opened and the door hit her on the right side of the head.  She had sudden onset of pain.  She felt lightheaded and was able to walk back to the break room and sit down.  She denies loss of consciousness.  She states after the hit she had progressively worsening headache.  She is also endorsing nausea without emesis and feeling lightheaded.  She took 2 ibuprofen last night which helped her pain.  Pain is currently 7 of 10 in severity. She had blurry vision yesterday afternoon that has since resolved. Denies fever, syncope, photophobia, phonophobia, UL throbbing, stiff neck, neck pain, rash, or "thunderclap" onset. She had a concussion in 2018 and was seen by outpatient neurology.    Past Medical History:  Diagnosis Date  . ADHD (attention deficit hyperactivity disorder)   . Anxiety   . Asthma    not active since childhood  . Depression   . POTS (postural orthostatic tachycardia syndrome)     Patient Active Problem List   Diagnosis Date Noted  . Overdose, undetermined intent, initial encounter 09/26/2016  . Overdose 09/26/2016  . Pain of left calf 08/24/2016  . Post concussion syndrome 08/13/2016  . ADD (attention deficit disorder) 08/13/2016  . Lightheaded 08/13/2016  . POTS (postural orthostatic tachycardia syndrome) 08/13/2016  . Cognitive dysfunction 08/13/2016    Past Surgical History:    Procedure Laterality Date  . WISDOM TOOTH EXTRACTION       OB History   No obstetric history on file.     Family History  Problem Relation Age of Onset  . Basal cell carcinoma Mother   . Healthy Father     Social History   Tobacco Use  . Smoking status: Never Smoker  . Smokeless tobacco: Never Used  Substance Use Topics  . Alcohol use: Yes  . Drug use: No    Types: Marijuana    Comment: denies 07/30/16    Home Medications Prior to Admission medications   Medication Sig Start Date End Date Taking? Authorizing Provider  CAMILA 0.35 MG tablet Take 1 tablet by mouth daily. 09/17/16   [provider]  etonogestrel (NEXPLANON) 68 MG IMPL implant Inject into the skin.    [provider]  fludrocortisone (FLORINEF) 0.1 MG tablet Take 1 tablet (0.1 mg total) by mouth daily. 08/13/16   Sater, Pearletha Furl, MD  ketoconazole (NIZORAL) 2 % shampoo Apply 1 application topically every 7 (seven) days. 09/16/16   [provider]  Omega-3 Fatty Acids (OMEGA 3 PO) Take 1 tablet by mouth daily.    [provider]  OVER THE COUNTER MEDICATION Take 1 tablet by mouth 3 (three) times daily. IB Guard--takes after a meal    [provider]  Probiotic Product (ALIGN PO) Take 1 tablet by mouth daily.    [provider]  Allergies    Albuterol  Review of Systems   Review of Systems  All other systems are reviewed and are negative for acute change except as noted in the HPI.   Physical Exam Updated Vital Signs BP 108/75 (BP Location: Left Arm)   Pulse 93   Temp 98.5 F (36.9 C) (Oral)   Resp 18   Ht 5\' 6"  (1.676 m)   Wt 59 kg   SpO2 97%   BMI 20.98 kg/m   Physical Exam Vitals and nursing note reviewed.  Constitutional:      Appearance: She is well-developed. She is not ill-appearing or toxic-appearing.  HENT:     Head: Normocephalic and atraumatic.     Comments: Mild tenderness to palpation of right temple. No overlying skin changes.      Nose: Nose normal.  Eyes:     General: No scleral icterus.       Right eye: No discharge.        Left eye: No discharge.     Conjunctiva/sclera: Conjunctivae normal.  Neck:     Vascular: No JVD.  Cardiovascular:     Rate and Rhythm: Normal rate and regular rhythm.     Pulses: Normal pulses.     Heart sounds: Normal heart sounds.  Pulmonary:     Effort: Pulmonary effort is normal.     Breath sounds: Normal breath sounds.  Abdominal:     General: There is no distension.  Musculoskeletal:        General: Normal range of motion.     Cervical back: Normal range of motion.  Skin:    General: Skin is warm and dry.  Neurological:     Mental Status: She is oriented to person, place, and time.     GCS: GCS eye subscore is 4. GCS verbal subscore is 5. GCS motor subscore is 6.     Comments: Mental Status:  Alert, oriented, thought content appropriate, able to give a coherent history. Speech fluent without evidence of aphasia. Able to follow 2 step commands without difficulty.  Cranial Nerves:  II:  Peripheral visual fields grossly normal, pupils equal, round, reactive to light III,IV, VI: ptosis not present, extra-ocular motions intact bilaterally  V,VII: smile symmetric, facial light touch sensation equal VIII: hearing grossly normal to voice  X: uvula elevates symmetrically  XI: bilateral shoulder shrug symmetric and strong XII: midline tongue extension without fassiculations Motor:  Normal tone. 5/5 in upper and lower extremities bilaterally including strong and equal grip strength and dorsiflexion/plantar flexion Sensory: Pinprick and light touch normal in all extremities.  Deep Tendon Reflexes: 2+ and symmetric in the biceps and patella Cerebellar: normal finger-to-nose with bilateral upper extremities Gait: normal gait and balance CV: distal pulses palpable throughout     Psychiatric:        Behavior: Behavior normal.     ED Results / Procedures / Treatments    Labs (all labs ordered are listed, but only abnormal results are displayed) Labs Reviewed  PREGNANCY, URINE    EKG None  Radiology No results found.  Procedures Procedures (including critical care time)  Medications Ordered in ED Medications  acetaminophen (TYLENOL) tablet 500 mg (500 mg Oral Given 03/22/20 1446)  ibuprofen (ADVIL) tablet 600 mg (600 mg Oral Given 03/22/20 1446)    ED Course  I have reviewed the triage vital signs and the nursing notes.  Pertinent labs & imaging results that were available during my care of the patient were reviewed by me  and considered in my medical decision making (see chart for details).    MDM Rules/Calculators/A&P                          History provided by patient with additional history obtained from chart review.    Patient with head injury which did not cause of loss of consciousness but with persistent headache since the initial trauma.  No evidence of skull fracture on physical exam. Patient is not taking anticoagulants, is less than 65 and has no history of subarachnoid or subdural hemorrhage. Patient denies nausea, vomiting, amnesia, vision changes,cognitive or memory dysfunction and vertigo.  Patient with no focal neurological deficits on physical exam.  Discussed thoroughly symptoms to return to the emergency department including severe headaches, disequilibrium, vomiting, double vision, extremity weakness, difficulty ambulating, or any other concerning symptoms.  Discussed the likely etiology of patient's symptoms being concussive in nature. Pregnancy test is negative. Patient Tylenol and ibuprofen.  On reassessment headache has significantly improved.  Discussed the risk versus benefit of CT scan at this time I do not believe she warrants one. Patient agrees that CT is not indicated at this time.  Patient will be discharged with information pertaining to diagnosis and advised to use over-the-counter medications like NSAIDs and  Tylenol for pain relief. Pt has also advised to not participate in contact sports until they are completely asymptomatic for at least 1 week or they are cleared by their doctor.     Portions of this note were generated with Scientist, clinical (histocompatibility and immunogenetics). Dictation errors may occur despite best attempts at proofreading.   Final Clinical Impression(s) / ED Diagnoses Final diagnoses:  Concussion without loss of consciousness, initial encounter    Rx / DC Orders ED Discharge Orders    None       Kathyrn Lass 03/22/20 1513    Raeford Razor, MD 03/22/20 1640

## 2020-03-22 NOTE — ED Triage Notes (Signed)
Pt was hit in head with commercial refrigerator door yesterday morning around 845 am states that she did not pass out but did feel lightheaded and continues to feel dizzy. Pt ambulatory at triage, drove herself to ED.
